# Patient Record
Sex: Female | Born: 1990 | Race: Black or African American | Hispanic: No | Marital: Single | Smoking: Current some day smoker
Health system: Southern US, Community
[De-identification: ages and names within clinical notes are randomized; demographics above are authoritative.]

## PROBLEM LIST (undated history)

## (undated) ENCOUNTER — Inpatient Hospital Stay (HOSPITAL_COMMUNITY): Payer: Self-pay

## (undated) DIAGNOSIS — T7840XA Allergy, unspecified, initial encounter: Secondary | ICD-10-CM

## (undated) DIAGNOSIS — F419 Anxiety disorder, unspecified: Secondary | ICD-10-CM

## (undated) DIAGNOSIS — G43909 Migraine, unspecified, not intractable, without status migrainosus: Secondary | ICD-10-CM

## (undated) DIAGNOSIS — D573 Sickle-cell trait: Secondary | ICD-10-CM

## (undated) DIAGNOSIS — D649 Anemia, unspecified: Secondary | ICD-10-CM

## (undated) HISTORY — PX: WISDOM TOOTH EXTRACTION: SHX21

## (undated) HISTORY — DX: Allergy, unspecified, initial encounter: T78.40XA

## (undated) HISTORY — DX: Anxiety disorder, unspecified: F41.9

## (undated) HISTORY — DX: Anemia, unspecified: D64.9

## (undated) HISTORY — PX: FOOT SURGERY: SHX648

---

## 2015-10-01 ENCOUNTER — Emergency Department (HOSPITAL_COMMUNITY)
Admission: EM | Admit: 2015-10-01 | Discharge: 2015-10-01 | Disposition: A | Discharge status: Home / Self Care | Payer: PRIVATE HEALTH INSURANCE | Attending: Emergency Medicine | Admitting: Emergency Medicine

## 2015-10-01 ENCOUNTER — Encounter (HOSPITAL_COMMUNITY): Payer: Self-pay | Admitting: Emergency Medicine

## 2015-10-01 ENCOUNTER — Emergency Department (HOSPITAL_COMMUNITY): Payer: PRIVATE HEALTH INSURANCE

## 2015-10-01 DIAGNOSIS — R51 Headache: Secondary | ICD-10-CM | POA: Diagnosis not present

## 2015-10-01 DIAGNOSIS — R072 Precordial pain: Secondary | ICD-10-CM | POA: Diagnosis not present

## 2015-10-01 DIAGNOSIS — F17200 Nicotine dependence, unspecified, uncomplicated: Secondary | ICD-10-CM | POA: Diagnosis not present

## 2015-10-01 DIAGNOSIS — R519 Headache, unspecified: Secondary | ICD-10-CM

## 2015-10-01 DIAGNOSIS — R079 Chest pain, unspecified: Secondary | ICD-10-CM

## 2015-10-01 HISTORY — DX: Migraine, unspecified, not intractable, without status migrainosus: G43.909

## 2015-10-01 LAB — BASIC METABOLIC PANEL
Anion gap: 6 (ref 5–15)
BUN: 10 mg/dL (ref 6–20)
CO2: 25 mmol/L (ref 22–32)
CREATININE: 0.85 mg/dL (ref 0.44–1.00)
Calcium: 9.1 mg/dL (ref 8.9–10.3)
Chloride: 106 mmol/L (ref 101–111)
GFR calc Af Amer: >60 mL/min (ref >60)
GLUCOSE: 93 mg/dL (ref 65–99)
POTASSIUM: 3.8 mmol/L (ref 3.5–5.1)
SODIUM: 137 mmol/L (ref 135–145)

## 2015-10-01 LAB — CBC
HEMATOCRIT: 38.2 % (ref 36.0–46.0)
Hemoglobin: 12.9 g/dL (ref 12.0–15.0)
MCH: 28.3 pg (ref 26.0–34.0)
MCHC: 33.8 g/dL (ref 30.0–36.0)
MCV: 83.8 fL (ref 78.0–100.0)
PLATELETS: 262 10*3/uL (ref 150–400)
RBC: 4.56 MIL/uL (ref 3.87–5.11)
RDW: 12.9 % (ref 11.5–15.5)
WBC: 9 10*3/uL (ref 4.0–10.5)

## 2015-10-01 LAB — I-STAT TROPONIN, ED: Troponin i, poc: 0 ng/mL (ref 0.00–0.08)

## 2015-10-01 MED ORDER — IBUPROFEN 400 MG PO TABS
600.0000 mg | ORAL_TABLET | Freq: Once | ORAL | Status: AC
  Administered 2015-10-01: 600 mg via ORAL
  Filled 2015-10-01: qty 1

## 2015-10-01 MED ORDER — METOCLOPRAMIDE HCL 10 MG PO TABS
10.0000 mg | ORAL_TABLET | Freq: Once | ORAL | Status: AC
  Administered 2015-10-01: 10 mg via ORAL
  Filled 2015-10-01: qty 1

## 2015-10-01 MED ORDER — DIPHENHYDRAMINE HCL 25 MG PO CAPS
50.0000 mg | ORAL_CAPSULE | Freq: Once | ORAL | Status: AC
  Administered 2015-10-01: 50 mg via ORAL
  Filled 2015-10-01: qty 2

## 2015-10-01 NOTE — ED Notes (Signed)
Discussed d/c instructions w/ pt. Pt states "why did you give me medications for my migraine when I am here for CP?" This RN explained advil will help w/ decreasing inflammation causing pain. Explained reglan + benadryl + advil work together to relieve migraine. Pt states that it's "wrong for staff to give a patient benadryl when they drove themselves here." This RN explained pt can wait in lobby until drowsiness wears off or take cab or bus home. Also explained SW to speak w/ pt in morning. Charge RN aware of pt and is to speak w/ pt.

## 2015-10-01 NOTE — Progress Notes (Signed)
CSW engaged with Patient in the waiting room re: homelessness issues. Patient requesting emergent housing. CSW encouraged Patient to contact the [place] [facility] for free housing counseling assistance in locating affordable rental housing or housing with support services for families and individuals in crisis and chronically homeless. CSW also provided Patient with a list of local shelters. Patient appreciative of resources provided. CSW signing off. Please contact if new need(s) arise.      Lance MussAshley Gardner,MSW, LCSW [facility] ED/41M Clinical Social Worker [phone]

## 2015-10-01 NOTE — ED Notes (Signed)
Pt to be d/c to wait in lobby for SW to discuss resources available to pt in morning.

## 2015-10-01 NOTE — ED Notes (Signed)
Patient ambulated without difficulty from department.

## 2015-10-01 NOTE — ED Notes (Signed)
Patient with chest pain and shortness of breath for the last few days.  She states that it radiates all over chest, no specific place.  Patient is CAOx4 in triage.

## 2015-10-01 NOTE — ED Notes (Signed)
This Consulting civil engineerCharge RN entered room to speak with patient in reference to request made by Hubert AzureBrooke H. RN. Upon entering patient appeared anxious. As this RN engaged patient in conversation patient was unable to sit calmly and converse with this RN to communicate concerns. Patient expressed that her displeasure with the course of treatment which she received here. She reported that the MD and RN did not address her chest pain, but instead treated her for her headache. Patient stated that her primary reason for visit this AM was chest pain and shortness of breath. Patient explained that she wasn't concerned about her headache. Patient went on to explain that she was upset because we gave her medications that made her sleepy, didn't give her the option to refuse, and didn't advise her of the symptoms which we were intending to treat by giving her said medications. Patient continues on now explaining that she can't be discharged because those medications are sedating and they may impair her ability to drive and get home safely. Patient requests "a place to sleep off her medications". This RN noted patient to be speaking clearly in full sentences, able to ambulate independently with steady gait, with not apparent impairment. Patient requested resource guide. I advised MD Oni of patient's request. Resource guide provided. Patient asking for information regarding social work consult. This RN advised patient that CSW is available after 0800, and that she may wait to see them in the waiting area.

## 2015-10-01 NOTE — Discharge Instructions (Signed)
The [facility] The [employer] [facility] 211 is a great source of information about community services available.  Access by dialing 2-1-1 from anywhere in [place], or by website -  PooledIncome.pl.   Other Local Resources (Updated 04/2015)  Shelters  Services   Phone Number and Address  [facility]  Housing for homeless and needy men with substance abuse issues [phone] [address] [place], [place]  [employer] [facility] [place]  Emergency assistance  [employer]  Pantry services [phone] [place], [place]  [facility]  Domestic violence shelter for women and their children [phone] [place], [place]  [facility]  Domestic violence shelter for women and their children [phone] [place], [place]  [facility])     The [facility] coordinates access to most shelters in [place]  Apply in person Monday - Friday, 10 am - 4 pm.    After hours/ weekends, contact individual shelters directly [phone] [address] [place], [place]  [facility] - [employer]  Food  Emergency financial assistance  Permanent supportive housing [phone] [address] [place], [place]   [employer]   Crisis assistance  Medication  Housing  Food  Utility assistance [phone] [address] [place], [place]  [employer]    Crisis assistance  Medication  Housing  Food  Utility assistance [phone] [address], [place], [place]  [employer] of [place]     Transitional housing  Case Proofreader assistance [phone] [address] [place], [place]  Lysle Morales, [employer] for adult men and women [phone] [address] [place], [place]  24-hour Crisis Line for those Facing Homelessness     [phone]   [employer] Guide Social Services The United Ways 211 is a great source of information about community services available.  Access by dialing 2-1-1 from anywhere in [place], or by website -  PooledIncome.pl.   Other Local Resources (Updated 04/2015)  Social  Contractor Number and Address  Aging, Disability and Transit Services  In-home services  Meals on wheels  Community meal sites  Transportation services  Adult day health care  Center for Active Retirement  Family caregiver support services [phone] [place], [place]  [place] [employer] of Social Services  Aging and adult services  Childrens services  Deaf-Blind services  Disability services  Guardianship  Hearing loss (assistive technology, interpreters, etc.)  Low-income services (health care, child care, housing, financial and nutrition assistance)  Medicaid  Pregnancy services  Utility assistance  Veterans services [phone] [place], [place] [address]    ElderCare  Assessment  Care management  Family education  Information and referral [phone] [address] [place], [place] [address]  [facility] Social Services  Aging and adult services  Childrens services  Deaf-Blind services  Disability services  Guardianship  Hearing loss (assistive technology, interpreters, etc.)  Low-income services (health care, child care, housing, financial and nutrition assistance)  [facility]  Pregnancy services  Utility assistance  Veterans services [phone] [address] [place], [place] [address]  [facility] Department of Social Services  Aging and adult services  Childrens services  Deaf-Blind services  Disability services  Guardianship  Hearing loss (assistive technology, interpreters, etc.)  Low-income services (health care, child care, housing, financial  and nutrition assistance)  Medicaid  Pregnancy services  Utility assistance  Veterans services [phone] [address] [place], [place] [address]  [facility] Department of Health and [employer] Services  Aging and adult services  Childrens services  Deaf-Blind services  Disability services  Guardianship  Hearing loss (assistive technology, interpreters, etc.)  Low-income services (health care, child care, housing, financial and nutrition assistance)  Medicaid  Pregnancy services  Utility assistance  Veterans services [phone] [address] [place], [place] [address]   [facility] Department of Social Services  Aging and adult services  Childrens services  Deaf-Blind services  Disability services  Guardianship  Hearing loss (assistive technology, interpreters, etc.)  Low-income services (health care, child care, housing, financial and nutrition assistance)  Medicaid  Pregnancy services  Utility assistance  Veterans services [phone] [address], [place], [place] [address]  [facility] Division of Social Services  Aging and adult services  Childrens services  Deaf-Blind services  Disability services  Guardianship  Hearing loss (assistive technology, interpreters, etc.)  Low-income services (health care, child care, housing, financial and nutrition assistance)  Medicaid  Pregnancy services  Utility assistance  Veterans services [phone] [place], [place] [address]  [facility] of [place]  Serves adults age 23 and over and their families  Caregiver information  Foster grandparents  Forensic scientistGrandparents Raising Grandchildren  Mobile meals  Refugee programs  Retired Herbalist& Senior Volunteer Program (RSVP)  Customer service managerural Outreach  Senior Center  Seniors Health Insurance Information Program [facility](SHIIP)  Administrator, Civil Serviceenior Wheels Medical Transportation Program  SeniorLine  Support Groups  TeleCare  [phone]  [address] [place], [place] [address]  [phone]  [address] [place], [place] [address]  Senior [employer]  Help Line  Home Care  Living at Goldman SachsHome  Meals on Wheels  Senior Lunch Program  Adult Day Center  Equipment [facility]  Support Groups  Care Partner Workshops  Referrals for chore and homemaker services [phone]  [address] [place], [place] [address]  The [employer]  Crisis assistance  Medication assistance  Rental assistance  Food pantry  Medication assistance  Housing assistance  Emergency food distribution  Utility assistance  Boys and Girls Clubs [phone] [address] [place], [place]   [phone](Tuesdays and Thursdays from 9am - 12 noon by appointment only) [address] [place], [place] [address]  [phone] [address] [place], [place] [address]

## 2015-10-01 NOTE — ED Provider Notes (Signed)
CSN: [id number]     Arrival date & time 10/01/15  [id number] History   First MD Initiated Contact with Patient 10/01/15 [phone]     Chief Complaint  Patient presents with  . Chest Pain  . Shortness of Breath     (Consider location/radiation/quality/duration/timing/severity/associated sxs/prior Treatment) HPI  Barbara Ramos is a 25 y.o. female with past history of migraines presenting today with chest pain shortness of breath. Patient states it has been going on for the past 3 days. It is substernal and a tight feeling. She states is worse sometimes when she speaks. Nothing makes it better is simply goes late on tone. She has associated shortness of breath. She denies vomiting or sweating. Patient states her pain is now getting her a headache which is consistent with her normal migraine headaches. These were gradual in onset. She denies any history of ACS, or blood clots. She denies any estrogen use, long distance travel, or surgeries. She has no cramping or swelling in her legs. There are no further complaints.  10 Systems reviewed and are negative for acute change except as noted in the HPI.      Past Medical History  Diagnosis Date  . Migraines    History reviewed. No pertinent past surgical history. No family history on file. Social History  Substance Use Topics  . Smoking status: Current Every Day Smoker  . Smokeless tobacco: None  . Alcohol Use: No   OB History    No data available     Review of Systems    Allergies  Review of patient's allergies indicates no known allergies.  Home Medications   Prior to Admission medications   Not on File   BP 114/83 mmHg  Pulse 64  Temp(Src) 98.2 F (36.8 C) (Oral)  Resp 13  Ht 5' 4.5" (1.638 m)  Wt 122 lb 9 oz (55.594 kg)  BMI 20.72 kg/m2  SpO2 99%  LMP 09/26/2015 (Approximate) Physical Exam  Constitutional: She is oriented to person, place, and time. She appears well-developed and well-nourished. No distress.  HENT:  Head:  Normocephalic and atraumatic.  Nose: Nose normal.  Mouth/Throat: Oropharynx is clear and moist. No oropharyngeal exudate.  Eyes: Conjunctivae and EOM are normal. Pupils are equal, round, and reactive to light. No scleral icterus.  Neck: Normal range of motion. Neck supple. No JVD present. No tracheal deviation present. No thyromegaly present.  Cardiovascular: Normal rate, regular rhythm and normal heart sounds.  Exam reveals no gallop and no friction rub.   No murmur heard. Pulmonary/Chest: Effort normal and breath sounds normal. No respiratory distress. She has no wheezes. She exhibits no tenderness.  Abdominal: Soft. Bowel sounds are normal. She exhibits no distension and no mass. There is no tenderness. There is no rebound and no guarding.  Musculoskeletal: Normal range of motion. She exhibits no edema or tenderness.  Lymphadenopathy:    She has no cervical adenopathy.  Neurological: She is alert and oriented to person, place, and time. No cranial nerve deficit. She exhibits normal muscle tone.  Skin: Skin is warm and dry. No rash noted. No erythema. No pallor.  Nursing note and vitals reviewed.   ED Course  Procedures (including critical care time) Labs Review Labs Reviewed  BASIC METABOLIC PANEL  CBC  I-STAT TROPOININ, ED    Imaging Review Dg Chest 2 View  10/01/2015  CLINICAL DATA:  Chest pain and shortness of breath EXAM: CHEST  2 VIEW COMPARISON:  None. FINDINGS: Normal heart size and mediastinal contours.  No acute infiltrate or edema. No effusion or pneumothorax. No acute osseous findings. IMPRESSION: Negative chest. Electronically Signed   By: Marnee Spring M.D.   On: 10/01/2015 04:22   I have personally reviewed and evaluated these images and lab results as part of my medical decision-making.   EKG Interpretation   Date/Time:  Friday October 01 2015 03:41:23 EDT Ventricular Rate:  63 PR Interval:  148 QRS Duration: 76 QT Interval:  398 QTC Calculation: 407 R Axis:    80 Text Interpretation:  Normal sinus rhythm Nonspecific T wave abnormality  Abnormal ECG No old tracing to compare Confirmed by Erroll Luna  [phone]) on 10/01/2015 4:15:09 AM      MDM   Final diagnoses:  None    Patient presents emergency department for chest pain and shortness of breath as well as headache. I do not believe her history is consistent with ACS, in addition her heart score is 0. She is PERC negative. I do not believe this represents an acute cardiac issue. She was given ibuprofen, Reglan, Benadryl for her headache. Advised to follow-up with her primary care physician, her vital signs were within her normal limits, she appears in no acute distress, patient safe for discharge.    Tomasita Crumble, MD 10/01/15 [phone]

## 2015-10-19 ENCOUNTER — Encounter (HOSPITAL_COMMUNITY): Payer: Self-pay | Admitting: Emergency Medicine

## 2015-10-19 ENCOUNTER — Emergency Department (HOSPITAL_COMMUNITY)
Admission: EM | Admit: 2015-10-19 | Discharge: 2015-10-19 | Disposition: A | Discharge status: Home / Self Care | Payer: PRIVATE HEALTH INSURANCE | Attending: Emergency Medicine | Admitting: Emergency Medicine

## 2015-10-19 ENCOUNTER — Other Ambulatory Visit: Payer: Self-pay

## 2015-10-19 DIAGNOSIS — F419 Anxiety disorder, unspecified: Secondary | ICD-10-CM | POA: Diagnosis not present

## 2015-10-19 DIAGNOSIS — R072 Precordial pain: Secondary | ICD-10-CM | POA: Diagnosis not present

## 2015-10-19 DIAGNOSIS — F17210 Nicotine dependence, cigarettes, uncomplicated: Secondary | ICD-10-CM | POA: Insufficient documentation

## 2015-10-19 MED ORDER — LORAZEPAM 1 MG PO TABS
1.0000 mg | ORAL_TABLET | Freq: Once | ORAL | Status: AC
  Administered 2015-10-19: 1 mg via ORAL
  Filled 2015-10-19: qty 1

## 2015-10-19 NOTE — ED Notes (Signed)
Pt c/o mid chest pain that occurs with breathing.  States she also has anxiety attacks and felt this was the onset of one but went to sleep and woke with continued chest discomfort.  Denies cough, congestion.  Appears nasally congested but reports its because she was crying earlier. 8/10 pain.
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
## 2015-10-19 NOTE — Discharge Instructions (Signed)
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
## 2017-05-24 ENCOUNTER — Emergency Department (HOSPITAL_COMMUNITY)
Admission: EM | Admit: 2017-05-24 | Discharge: 2017-05-24 | Disposition: A | Discharge status: Home / Self Care | Payer: PRIVATE HEALTH INSURANCE | Attending: Emergency Medicine | Admitting: Emergency Medicine

## 2017-05-24 ENCOUNTER — Other Ambulatory Visit: Payer: Self-pay

## 2017-05-24 ENCOUNTER — Encounter (HOSPITAL_COMMUNITY): Payer: Self-pay | Admitting: Neurology

## 2017-05-24 DIAGNOSIS — R51 Headache: Secondary | ICD-10-CM

## 2017-05-24 DIAGNOSIS — J029 Acute pharyngitis, unspecified: Secondary | ICD-10-CM | POA: Diagnosis not present

## 2017-05-24 DIAGNOSIS — F17210 Nicotine dependence, cigarettes, uncomplicated: Secondary | ICD-10-CM | POA: Insufficient documentation

## 2017-05-24 DIAGNOSIS — R519 Headache, unspecified: Secondary | ICD-10-CM

## 2017-05-24 LAB — INFLUENZA PANEL BY PCR (TYPE A & B)
Influenza A By PCR: NEGATIVE
Influenza B By PCR: NEGATIVE

## 2017-05-24 LAB — RAPID STREP SCREEN (MED CTR MEBANE ONLY): Streptococcus, Group A Screen (Direct): NEGATIVE

## 2017-05-24 MED ORDER — ACETAMINOPHEN 325 MG PO TABS
650.0000 mg | ORAL_TABLET | Freq: Once | ORAL | Status: AC | PRN
  Administered 2017-05-24: 650 mg via ORAL
  Filled 2017-05-24: qty 2

## 2017-05-24 MED ORDER — KETOROLAC TROMETHAMINE 30 MG/ML IJ SOLN
30.0000 mg | Freq: Once | INTRAMUSCULAR | Status: AC
  Administered 2017-05-24: 30 mg via INTRAVENOUS
  Filled 2017-05-24: qty 1

## 2017-05-24 MED ORDER — SODIUM CHLORIDE 0.9 % IV BOLUS (SEPSIS)
1000.0000 mL | Freq: Once | INTRAVENOUS | Status: AC
  Administered 2017-05-24: 1000 mL via INTRAVENOUS

## 2017-05-24 NOTE — ED Notes (Signed)
Pt ambulated to room with no difficulty.  

## 2017-05-24 NOTE — ED Provider Notes (Signed)
[facility]   CSN: [id number] Arrival date & time: 05/24/17  1402     History   Chief Complaint Chief Complaint  Patient presents with  . Migraine    HPI Barbara Ramos is a 27 y.o. female.  HPI   27 year old female presents today with complaints of sore throat and headache.  Patient has a history of migraines.  She notes yesterday she started having a migraine typical of previous, she notes this is diffuse, non-localized.  She denies any neurological deficits.  She notes that she took Excedrin last night and went to bed but woke up and still continued to have a migraine.  Patient notes that she also started having a sore throat today, no difficulty swallowing or pooling of secretions.  She denies any neck stiffness, head trauma, cough, congestion runny nose, abdominal pain nausea or vomiting.  Patient was vaccinated as a child, she did not receive a flu vaccination this year.    Past Medical History:  Diagnosis Date  . Migraines     There are no active problems to display for this patient.   History reviewed. No pertinent surgical history.  OB History    No data available       Home Medications    Prior to Admission medications   Not on File    Family History No family history on file.  Social History Social History   Tobacco Use  . Smoking status: Current Every Day Smoker    Packs/day: 0.01    Types: Cigarettes  Substance Use Topics  . Alcohol use: No  . Drug use: No     Allergies   Patient has no known allergies.   Review of Systems Review of Systems  All other systems reviewed and are negative.    Physical Exam Updated Vital Signs BP 122/82 (BP Location: Right Arm)   Pulse (!) 110   Temp 98.5 F (36.9 C) (Oral)   Resp 18   Ht 5' 4.5" (1.638 m)   Wt 66.7 kg (147 lb)   LMP 05/20/2017   SpO2 100%   BMI 24.84 kg/m   Physical Exam  Constitutional: She is oriented to person, place,  and time. She appears well-developed and well-nourished.  HENT:  Head: Normocephalic and atraumatic.  Mouth/Throat: Tonsils are 2+ on the right. Tonsils are 2+ on the left.  Bilateral tonsillar swelling small amount of exudate on the right tonsil, they are symmetrical no signs of PTA or RPA, uvula midline no edema rises with phonation no posterior oropharynx swelling or edema  Eyes: Conjunctivae are normal. Pupils are equal, round, and reactive to light. Right eye exhibits no discharge. Left eye exhibits no discharge. No scleral icterus.  Neck: Normal range of motion. No JVD present. No tracheal deviation present.  Pulmonary/Chest: Effort normal. No stridor.  Neurological: She is alert and oriented to person, place, and time. She displays normal reflexes. No cranial nerve deficit or sensory deficit. She exhibits normal muscle tone. Coordination normal.  Skin: Skin is warm.  Psychiatric: She has a normal mood and affect. Her behavior is normal. Judgment and thought content normal.  Nursing note and vitals reviewed.    ED Treatments / Results  Labs (all labs ordered are listed, but only abnormal results are displayed) Labs Reviewed  RAPID STREP SCREEN (NOT AT [facility])  CULTURE, GROUP A STREP [facility])  INFLUENZA PANEL BY PCR (TYPE A & B)    EKG  EKG Interpretation None  Radiology No results found.  Procedures Procedures (including critical care time)  Medications Ordered in ED Medications  acetaminophen (TYLENOL) tablet 650 mg (650 mg Oral Given 05/24/17 1446)  ketorolac (TORADOL) 30 MG/ML injection 30 mg (30 mg Intravenous Given 05/24/17 1633)  sodium chloride 0.9 % bolus 1,000 mL (1,000 mLs Intravenous New Bag/Given 05/24/17 1633)     Initial Impression / Assessment and Plan / ED Course  I have reviewed the triage vital signs and the nursing notes.  Pertinent labs & imaging results that were available during my care of the patient were reviewed by me and considered in my  medical decision making (see chart for details).      Final Clinical Impressions(s) / ED Diagnoses   Final diagnoses:  Viral pharyngitis  Nonintractable headache, unspecified chronicity pattern, unspecified headache type   Labs: Influenza, rapid strep  Imaging:  Consults:  Therapeutics:  Discharge Meds:   Assessment/Plan: 27 year old female presents today with likely viral pharyngitis.  Patient with a sore throat and headache.  She does have signs consistent with pharyngitis, negative strep negative influenza here.  With her temperature was improved with antipyretics, headache completely resolved.  She has no neck stiffness, no confusion, no signs of bacterial meningitis.  I did discuss her strict return precautions in detail, patient verbalized she would return within the next 24 hours if symptoms persisted, or sooner as needed.  She verbalized understanding and agreement to today's plan had no further questions or concerns at time of discharge.   ED Discharge Orders    None       Rosalio Loud 05/24/17 1823    Nira Conn, MD 05/24/17 2043

## 2017-05-24 NOTE — ED Triage Notes (Addendum)
Pt reports migraine hx, started yesterday. Denies v/d. Has hx of same. C/o frontal h/a. Denies cough, sneezing. Temp 100.5. Took Excedrin Migraine with no relief. She does report sore throat. Tonsils are enlarged, white coated.

## 2017-05-24 NOTE — Discharge Instructions (Signed)
Please read the attached information.  Please use Tylenol or ibuprofen as needed for discomfort.  If your headache returns and is severe and does not improve with over-the-counter medication please return to the emergency room immediately.  If you develop any of the concerning signs or symptoms we discussed including neck stiffness, confusion, or any other concerning signs or symptoms return immediately for evaluation.

## 2017-05-26 LAB — CULTURE, GROUP A STREP (THRC)

## 2017-10-11 ENCOUNTER — Ambulatory Visit (INDEPENDENT_AMBULATORY_CARE_PROVIDER_SITE_OTHER)
Admission: EM | Admit: 2017-10-11 | Discharge: 2017-10-11 | Disposition: A | Discharge status: Home / Self Care | Payer: PRIVATE HEALTH INSURANCE | Source: Home / Self Care | Attending: Family Medicine | Admitting: Family Medicine

## 2017-10-11 ENCOUNTER — Encounter (HOSPITAL_COMMUNITY): Payer: Self-pay | Admitting: Emergency Medicine

## 2017-10-11 ENCOUNTER — Other Ambulatory Visit: Payer: Self-pay

## 2017-10-11 ENCOUNTER — Emergency Department (HOSPITAL_COMMUNITY)
Admission: EM | Admit: 2017-10-11 | Discharge: 2017-10-11 | Disposition: A | Discharge status: Home / Self Care | Payer: PRIVATE HEALTH INSURANCE | Attending: Emergency Medicine | Admitting: Emergency Medicine

## 2017-10-11 DIAGNOSIS — Z886 Allergy status to analgesic agent status: Secondary | ICD-10-CM

## 2017-10-11 DIAGNOSIS — R109 Unspecified abdominal pain: Secondary | ICD-10-CM

## 2017-10-11 DIAGNOSIS — Z202 Contact with and (suspected) exposure to infections with a predominantly sexual mode of transmission: Secondary | ICD-10-CM | POA: Diagnosis not present

## 2017-10-11 DIAGNOSIS — R1030 Lower abdominal pain, unspecified: Secondary | ICD-10-CM | POA: Diagnosis present

## 2017-10-11 DIAGNOSIS — N938 Other specified abnormal uterine and vaginal bleeding: Secondary | ICD-10-CM | POA: Insufficient documentation

## 2017-10-11 DIAGNOSIS — Z113 Encounter for screening for infections with a predominantly sexual mode of transmission: Secondary | ICD-10-CM | POA: Diagnosis not present

## 2017-10-11 DIAGNOSIS — F17210 Nicotine dependence, cigarettes, uncomplicated: Secondary | ICD-10-CM | POA: Diagnosis not present

## 2017-10-11 DIAGNOSIS — N939 Abnormal uterine and vaginal bleeding, unspecified: Secondary | ICD-10-CM

## 2017-10-11 LAB — URINALYSIS, ROUTINE W REFLEX MICROSCOPIC
Bilirubin Urine: NEGATIVE
Glucose, UA: NEGATIVE mg/dL
Ketones, ur: NEGATIVE mg/dL
Nitrite: NEGATIVE
Protein, ur: 30 mg/dL — AB
RBC / HPF: >50 RBC/hpf — ABNORMAL HIGH (ref 0–5)
Specific Gravity, Urine: 1.012 (ref 1.005–1.030)
pH: 6 (ref 5.0–8.0)

## 2017-10-11 LAB — CBC
HCT: 40.9 % (ref 36.0–46.0)
Hemoglobin: 13.4 g/dL (ref 12.0–15.0)
MCH: 29 pg (ref 26.0–34.0)
MCHC: 32.8 g/dL (ref 30.0–36.0)
MCV: 88.5 fL (ref 78.0–100.0)
PLATELETS: 267 10*3/uL (ref 150–400)
RBC: 4.62 MIL/uL (ref 3.87–5.11)
RDW: 13.2 % (ref 11.5–15.5)
WBC: 8.2 10*3/uL (ref 4.0–10.5)

## 2017-10-11 LAB — COMPREHENSIVE METABOLIC PANEL
ALK PHOS: 60 U/L (ref 38–126)
ALT: 14 U/L (ref 14–54)
AST: 16 U/L (ref 15–41)
Albumin: 4 g/dL (ref 3.5–5.0)
Anion gap: 6 (ref 5–15)
BILIRUBIN TOTAL: 0.5 mg/dL (ref 0.3–1.2)
BUN: 9 mg/dL (ref 6–20)
CALCIUM: 9.2 mg/dL (ref 8.9–10.3)
CHLORIDE: 110 mmol/L (ref 101–111)
CO2: 25 mmol/L (ref 22–32)
Creatinine, Ser: 0.78 mg/dL (ref 0.44–1.00)
GFR calc Af Amer: >60 mL/min (ref >60)
Glucose, Bld: 98 mg/dL (ref 65–99)
Potassium: 4.4 mmol/L (ref 3.5–5.1)
Sodium: 141 mmol/L (ref 135–145)
Total Protein: 6.8 g/dL (ref 6.5–8.1)

## 2017-10-11 LAB — I-STAT BETA HCG BLOOD, ED (MC, WL, AP ONLY)
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
## 2017-10-11 NOTE — ED Provider Notes (Signed)
[facility] Provider Note   CSN: [id number] Arrival date & time: 10/11/17  [id number]     History   Chief Complaint Chief Complaint  Patient presents with  . Abdominal Pain    HPI Barbara Ramos is a 27 y.o. female presenting for evaluation of vaginal spotting and lower abdominal discomfort.  Patient states that she and her fianc been trying to get pregnant.  Her period started today, it was light and pink.  It has stopped already.  She states this is abnormal for her.  She also reports that her "uterus feels different."  She denies pain and states it is not severe or concerning.  She wants to be checked to see if she is pregnant.  She does not feel that there is anything wrong at this time.  She reports urinary frequency.  She denies dysuria.  She denies fevers, chills, chest pain, shortness of breath, or abdominal pain.  She reports mild nausea without vomiting.  She has taken multiple home pregnancy tests which have all been negative.  She has no medical problems, takes no medications daily.  She denies vaginal discharge.  HPI  Past Medical History:  Diagnosis Date  . Migraines     There are no active problems to display for this patient.   History reviewed. No pertinent surgical history.   OB History   None      Home Medications    Prior to Admission medications   Not on File    Family History Family History  Problem Relation Age of Onset  . Healthy Mother     Social History Social History   Tobacco Use  . Smoking status: Current Every Day Smoker    Packs/day: 0.01    Types: Cigarettes  Substance Use Topics  . Alcohol use: No  . Drug use: No     Allergies   Ibuprofen   Review of Systems Review of Systems  Gastrointestinal: Positive for nausea. Negative for abdominal pain and vomiting.  Genitourinary: Positive for frequency and vaginal bleeding (resolved). Negative for dysuria and hematuria.     Physical  Exam Updated Vital Signs BP 122/84   Pulse 81   Temp 98.3 F (36.8 C) (Oral)   Resp 16   LMP 10/10/2017 (Exact Date)   SpO2 100%   Physical Exam  Constitutional: She is oriented to person, place, and time. She appears well-developed and well-nourished. No distress.  Appears in no distress  HENT:  Head: Normocephalic and atraumatic.  Eyes: Pupils are equal, round, and reactive to light. Conjunctivae and EOM are normal.  Neck: Normal range of motion. Neck supple.  Cardiovascular: Normal rate, regular rhythm and intact distal pulses.  Pulmonary/Chest: Effort normal and breath sounds normal. No respiratory distress. She has no wheezes.  Abdominal: Soft. Bowel sounds are normal. She exhibits no distension and no mass. There is no tenderness. There is no rebound and no guarding.  No tenderness to palpation of the abdomen.  Soft without rigidity, guarding, distention.  Musculoskeletal: Normal range of motion.  Neurological: She is alert and oriented to person, place, and time.  Skin: Skin is warm and dry.  Psychiatric: She has a normal mood and affect.  Nursing note and vitals reviewed.    ED Treatments / Results  Labs (all labs ordered are listed, but only abnormal results are displayed) Labs Reviewed  URINALYSIS, ROUTINE W REFLEX MICROSCOPIC - Abnormal; Notable for the following components:      Result Value  Color, Urine PINK (*)    APPearance HAZY (*)    Hgb urine dipstick LARGE (*)    Protein, ur 30 (*)    Leukocytes, UA TRACE (*)    RBC / HPF >50 (*)    Bacteria, UA RARE (*)    All other components within normal limits  COMPREHENSIVE METABOLIC PANEL  CBC  I-STAT BETA HCG BLOOD, ED ([facility], [facility], [facility] ONLY)    EKG None  Radiology No results found.  Procedures Procedures (including critical care time)  Medications Ordered in ED Medications - No data to display   Initial Impression / Assessment and Plan / ED Course  I have reviewed the triage vital signs and the  nursing notes.  Pertinent labs & imaging results that were available during my care of the patient were reviewed by me and considered in my medical decision making (see chart for details).     Pt presenting for evaluation for possible pregnancy.  Physical exam reassuring, patient is afebrile not tachycardic.  She appears nontoxic.  She is walking without difficulty or signs of pain.  She is nontoxic.  Abdominal exam reassuring, no tenderness.  No vaginal bleeding or discharge.  Patient states pain is not severe, nor does she feel that there are any complications at this time.  Requesting pregnancy test.  Will obtain labs, pregnancy, and urine.  Labs show patient is not pregnant.  Otherwise reassuring, no leukocytosis, creatinine stable.  Urine without infection.  Shows blood, patient started her period today.  Discussed with patient.  Discussed importance of follow-up with OB/GYN to discuss difficulties getting pregnant, and for further pregnancy needs.  At this time, patient appears safe for discharge.  Return precautions given.  Patient states she understands and agrees to plan.   Final Clinical Impressions(s) / ED Diagnoses   Final diagnoses:  Vagina bleeding    ED Discharge Orders    None       Alveria Apley, PA-C 10/11/17 1304    Terrilee Files, MD 10/12/17 [phone]

## 2017-10-11 NOTE — Discharge Instructions (Addendum)
Follow-up with OB/ GYN for further evaluation of your symptoms.  Follow-up with concerns about pregnancy and for any future pregnancy needs. Return to the emergency room if you develop fevers, severe abdominal pain, inability to urinate, or any new or concerning symptoms.

## 2017-10-11 NOTE — ED Triage Notes (Signed)
Patient to ED c/o lower abdominal cramping and spotting since yesterday. Patient states she is trying to get pregnant and LMP was supposed to begin today, but the bleeding and cramping she is having are not like her normal cycle. Denies N/V/D, no fevers/chills.

## 2017-10-11 NOTE — ED Triage Notes (Signed)
Patient denies any vaginal discharge.  Patient denies any specific suspicion for an std.

## 2017-10-11 NOTE — Discharge Instructions (Signed)
We will notify you of any positive results and if any treatments are indicated.  You may also monitor on your MyChart online to see results. Use of condoms until results finalize. Please establish with a gynecologist if any further questions or concerns in regards to getting pregnant

## 2017-10-12 LAB — URINE CYTOLOGY ANCILLARY ONLY
Chlamydia: NEGATIVE
Neisseria Gonorrhea: NEGATIVE
Trichomonas: POSITIVE — AB

## 2017-10-12 LAB — RPR: RPR: NONREACTIVE

## 2017-10-12 LAB — HIV ANTIBODY (ROUTINE TESTING W REFLEX): HIV SCREEN 4TH GENERATION: NONREACTIVE

## 2017-10-13 ENCOUNTER — Other Ambulatory Visit: Payer: Self-pay

## 2017-10-13 ENCOUNTER — Ambulatory Visit (HOSPITAL_COMMUNITY)
Admission: EM | Admit: 2017-10-13 | Discharge: 2017-10-13 | Disposition: A | Discharge status: Home / Self Care | Payer: PRIVATE HEALTH INSURANCE | Attending: Internal Medicine | Admitting: Internal Medicine

## 2017-10-13 ENCOUNTER — Encounter (HOSPITAL_COMMUNITY): Payer: Self-pay

## 2017-10-13 DIAGNOSIS — A599 Trichomoniasis, unspecified: Secondary | ICD-10-CM

## 2017-10-13 DIAGNOSIS — Z1389 Encounter for screening for other disorder: Secondary | ICD-10-CM | POA: Diagnosis not present

## 2017-10-13 LAB — POCT URINALYSIS DIP (DEVICE)
BILIRUBIN URINE: NEGATIVE
Glucose, UA: NEGATIVE mg/dL
KETONES UR: NEGATIVE mg/dL
NITRITE: NEGATIVE
PH: 7 (ref 5.0–8.0)
PROTEIN: NEGATIVE mg/dL
Specific Gravity, Urine: 1.015 (ref 1.005–1.030)
Urobilinogen, UA: 0.2 mg/dL (ref 0.0–1.0)

## 2017-10-13 MED ORDER — METRONIDAZOLE 500 MG PO TABS
ORAL_TABLET | ORAL | Status: AC
  Filled 2017-10-13: qty 3

## 2017-10-13 MED ORDER — METRONIDAZOLE 500 MG PO TABS
ORAL_TABLET | ORAL | Status: AC
  Filled 2017-10-13: qty 4

## 2017-10-13 MED ORDER — METRONIDAZOLE 500 MG PO TABS
2000.0000 mg | ORAL_TABLET | Freq: Once | ORAL | Status: AC
Start: 2017-10-13 — End: 2017-10-13
  Administered 2017-10-13: 2000 mg via ORAL

## 2017-10-13 NOTE — ED Triage Notes (Signed)
[facility], pt states she has been informed that she currently has Trichomoniasis and wants to be treated

## 2017-10-13 NOTE — Discharge Instructions (Addendum)
Urine cytology was positive for trichomoniasis performed 2 days ago.   2 gram of metronidazole given in office Advised to avoid alcohol after treatment given Follow up with PCP if symptoms persists Return or go to the ED if you experience new or worsening symptoms
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
## 2017-10-15 ENCOUNTER — Telehealth (HOSPITAL_COMMUNITY): Payer: Self-pay

## 2017-10-15 LAB — URINE CYTOLOGY ANCILLARY ONLY: Candida vaginitis: NEGATIVE

## 2017-10-16 ENCOUNTER — Telehealth (HOSPITAL_COMMUNITY): Payer: Self-pay

## 2017-10-16 NOTE — Telephone Encounter (Signed)
Bacterial Vaginosis test is positive.  Prescription for metronidazole was given at the urgent care visit. Pt contacted regarding results. Answered all questions. Verbalized understanding.   

## 2017-10-17 ENCOUNTER — Encounter: Payer: Self-pay | Admitting: Physician Assistant

## 2017-10-17 ENCOUNTER — Ambulatory Visit (INDEPENDENT_AMBULATORY_CARE_PROVIDER_SITE_OTHER): Payer: PRIVATE HEALTH INSURANCE | Admitting: Physician Assistant

## 2017-10-17 ENCOUNTER — Other Ambulatory Visit: Payer: Self-pay

## 2017-10-17 VITALS — BP 100/62 | HR 79 | Temp 98.8°F | Resp 16 | Ht 65.0 in | Wt 152.2 lb

## 2017-10-17 DIAGNOSIS — Z1329 Encounter for screening for other suspected endocrine disorder: Secondary | ICD-10-CM

## 2017-10-17 DIAGNOSIS — Z0000 Encounter for general adult medical examination without abnormal findings: Secondary | ICD-10-CM | POA: Diagnosis not present

## 2017-10-17 DIAGNOSIS — Z130 Encounter for screening for diseases of the blood and blood-forming organs and certain disorders involving the immune mechanism: Secondary | ICD-10-CM

## 2017-10-17 DIAGNOSIS — Z13228 Encounter for screening for other metabolic disorders: Secondary | ICD-10-CM

## 2017-10-17 DIAGNOSIS — Z124 Encounter for screening for malignant neoplasm of cervix: Secondary | ICD-10-CM | POA: Diagnosis not present

## 2017-10-17 LAB — POCT URINALYSIS DIP (MANUAL ENTRY)
Bilirubin, UA: NEGATIVE
Blood, UA: NEGATIVE
Glucose, UA: NEGATIVE mg/dL
Ketones, POC UA: NEGATIVE mg/dL
Leukocytes, UA: NEGATIVE
Nitrite, UA: NEGATIVE
Protein Ur, POC: NEGATIVE mg/dL
Spec Grav, UA: 1.015 (ref 1.010–1.025)
Urobilinogen, UA: 0.2 U/dL
pH, UA: 6 (ref 5.0–8.0)

## 2017-10-17 NOTE — Patient Instructions (Addendum)
We will contact you with the results of your labs.   Thank you for coming in today. Feel free to call PCP if you have any questions or further requests.  Please consider signing up for MyChart if you do not already have it, as this is a great way to communicate with me.  Best,  Whitney McVey, PA-C  Health Maintenance, Female Adopting a healthy lifestyle and getting preventive care can go a long way to promote health and wellness. Talk with your health care provider about what schedule of regular examinations is right for you. This is a good chance for you to check in with your provider about disease prevention and staying healthy. In between checkups, there are plenty of things you can do on your own. Experts have done a lot of research about which lifestyle changes and preventive measures are most likely to keep you healthy. Ask your health care provider for more information. Weight and diet Eat a healthy diet  Be sure to include plenty of vegetables, fruits, low-fat dairy products, and lean protein.  Do not eat a lot of foods high in solid fats, added sugars, or salt.  Get regular exercise. This is one of the most important things you can do for your health. ? Most adults should exercise for at least 150 minutes each week. The exercise should increase your heart rate and make you sweat (moderate-intensity exercise). ? Most adults should also do strengthening exercises at least twice a week. This is in addition to the moderate-intensity exercise.  Maintain a healthy weight  Body mass index (BMI) is a measurement that can be used to identify possible weight problems. It estimates body fat based on height and weight. Your health care provider can help determine your BMI and help you achieve or maintain a healthy weight.  For females 25 years of age and older: ? A BMI below 18.5 is considered underweight. ? A BMI of 18.5 to 24.9 is normal. ? A BMI of 25 to 29.9 is considered  overweight. ? A BMI of 30 and above is considered obese.  Watch levels of cholesterol and blood lipids  You should start having your blood tested for lipids and cholesterol at 25 years of age, then have this test every 5 years.  You may need to have your cholesterol levels checked more often if: ? Your lipid or cholesterol levels are high. ? You are older than 25 years of age. ? You are at high risk for heart disease.  Cancer screening Lung Cancer  Lung cancer screening is recommended for adults 70-25 years old who are at high risk for lung cancer because of a history of smoking.  A yearly low-dose CT scan of the lungs is recommended for people who: ? Currently smoke. ? Have quit within the past 15 years. ? Have at least a 30-pack-year history of smoking. A pack year is smoking an average of one pack of cigarettes a day for 1 year.  Yearly screening should continue until it has been 15 years since you quit.  Yearly screening should stop if you develop a health problem that would prevent you from having lung cancer treatment.  Breast Cancer  Practice breast self-awareness. This means understanding how your breasts normally appear and feel.  It also means doing regular breast self-exams. Let your health care provider know about any changes, no matter how small.  If you are in your 20s or 30s, you should have a clinical breast exam (CBE)  by a health care provider every 1-3 years as part of a regular health exam.  If you are 34 or older, have a CBE every year. Also consider having a breast X-ray (mammogram) every year.  If you have a family history of breast cancer, talk to your health care provider about genetic screening.  If you are at high risk for breast cancer, talk to your health care provider about having an MRI and a mammogram every year.  Breast cancer gene (BRCA) assessment is recommended for women who have family members with BRCA-related cancers. BRCA-related cancers  include: ? Breast. ? Ovarian. ? Tubal. ? Peritoneal cancers.  Results of the assessment will determine the need for genetic counseling and BRCA1 and BRCA2 testing.  Cervical Cancer Your health care provider may recommend that you be screened regularly for cancer of the pelvic organs (ovaries, uterus, and vagina). This screening involves a pelvic examination, including checking for microscopic changes to the surface of your cervix (Pap test). You may be encouraged to have this screening done every 3 years, beginning at age 25.  For women ages 75-65, health care providers may recommend pelvic exams and Pap testing every 3 years, or they may recommend the Pap and pelvic exam, combined with testing for human papilloma virus (HPV), every 5 years. Some types of HPV increase your risk of cervical cancer. Testing for HPV may also be done on women of any age with unclear Pap test results.  Other health care providers may not recommend any screening for nonpregnant women who are considered low risk for pelvic cancer and who do not have symptoms. Ask your health care provider if a screening pelvic exam is right for you.  If you have had past treatment for cervical cancer or a condition that could lead to cancer, you need Pap tests and screening for cancer for at least 20 years after your treatment. If Pap tests have been discontinued, your risk factors (such as having a new sexual partner) need to be reassessed to determine if screening should resume. Some women have medical problems that increase the chance of getting cervical cancer. In these cases, your health care provider may recommend more frequent screening and Pap tests.  Colorectal Cancer  This type of cancer can be detected and often prevented.  Routine colorectal cancer screening usually begins at 25 years of age and continues through 25 years of age.  Your health care provider may recommend screening at an earlier age if you have risk factors  for colon cancer.  Your health care provider may also recommend using home test kits to check for hidden blood in the stool.  A small camera at the end of a tube can be used to examine your colon directly (sigmoidoscopy or colonoscopy). This is done to check for the earliest forms of colorectal cancer.  Routine screening usually begins at age 50.  Direct examination of the colon should be repeated every 5-10 years through 25 years of age. However, you may need to be screened more often if early forms of precancerous polyps or small growths are found.  Skin Cancer  Check your skin from head to toe regularly.  Tell your health care provider about any new moles or changes in moles, especially if there is a change in a mole's shape or color.  Also tell your health care provider if you have a mole that is larger than the size of a pencil eraser.  Always use sunscreen. Apply sunscreen liberally and  repeatedly throughout the day.  Protect yourself by wearing long sleeves, pants, a wide-brimmed hat, and sunglasses whenever you are outside.  Heart disease, diabetes, and high blood pressure  High blood pressure causes heart disease and increases the risk of stroke. High blood pressure is more likely to develop in: ? People who have blood pressure in the high end of the normal range (130-139/85-89 mm Hg). ? People who are overweight or obese. ? People who are African American.  If you are 87-47 years of age, have your blood pressure checked every 3-5 years. If you are 46 years of age or older, have your blood pressure checked every year. You should have your blood pressure measured twice-once when you are at a hospital or clinic, and once when you are not at a hospital or clinic. Record the average of the two measurements. To check your blood pressure when you are not at a hospital or clinic, you can use: ? An automated blood pressure machine at a pharmacy. ? A home blood pressure monitor.  If  you are between 25 years and 51 years old, ask your health care provider if you should take aspirin to prevent strokes.  Have regular diabetes screenings. This involves taking a blood sample to check your fasting blood sugar level. ? If you are at a normal weight and have a low risk for diabetes, have this test once every three years after 25 years of age. ? If you are overweight and have a high risk for diabetes, consider being tested at a younger age or more often. Preventing infection Hepatitis B  If you have a higher risk for hepatitis B, you should be screened for this virus. You are considered at high risk for hepatitis B if: ? You were born in a country where hepatitis B is common. Ask your health care provider which countries are considered high risk. ? Your parents were born in a high-risk country, and you have not been immunized against hepatitis B (hepatitis B vaccine). ? You have HIV or AIDS. ? You use needles to inject street drugs. ? You live with someone who has hepatitis B. ? You have had sex with someone who has hepatitis B. ? You get hemodialysis treatment. ? You take certain medicines for conditions, including cancer, organ transplantation, and autoimmune conditions.  Hepatitis C  Blood testing is recommended for: ? Everyone born from [birth year] through 1963. ? Anyone with known risk factors for hepatitis C.  Sexually transmitted infections (STIs)  You should be screened for sexually transmitted infections (STIs) including gonorrhea and chlamydia if: ? You are sexually active and are younger than 25 years of age. ? You are older than 25 years of age and your health care provider tells you that you are at risk for this type of infection. ? Your sexual activity has changed since you were last screened and you are at an increased risk for chlamydia or gonorrhea. Ask your health care provider if you are at risk.  If you do not have HIV, but are at risk, it may be recommended  that you take a prescription medicine daily to prevent HIV infection. This is called pre-exposure prophylaxis (PrEP). You are considered at risk if: ? You are sexually active and do not regularly use condoms or know the HIV status of your partner(s). ? You take drugs by injection. ? You are sexually active with a partner who has HIV.  Talk with your health care provider about whether you  are at high risk of being infected with HIV. If you choose to begin PrEP, you should first be tested for HIV. You should then be tested every 3 months for as long as you are taking PrEP. Pregnancy  If you are premenopausal and you may become pregnant, ask your health care provider about preconception counseling.  If you may become pregnant, take 400 to 800 micrograms (mcg) of folic acid every day.  If you want to prevent pregnancy, talk to your health care provider about birth control (contraception). Osteoporosis and menopause  Osteoporosis is a disease in which the bones lose minerals and strength with aging. This can result in serious bone fractures. Your risk for osteoporosis can be identified using a bone density scan.  If you are 8 years of age or older, or if you are at risk for osteoporosis and fractures, ask your health care provider if you should be screened.  Ask your health care provider whether you should take a calcium or vitamin D supplement to lower your risk for osteoporosis.  Menopause may have certain physical symptoms and risks.  Hormone replacement therapy may reduce some of these symptoms and risks. Talk to your health care provider about whether hormone replacement therapy is right for you. Follow these instructions at home:  Schedule regular health, dental, and eye exams.  Stay current with your immunizations.  Do not use any tobacco products including cigarettes, chewing tobacco, or electronic cigarettes.  If you are pregnant, do not drink alcohol.  If you are  breastfeeding, limit how much and how often you drink alcohol.  Limit alcohol intake to no more than 1 drink per day for nonpregnant women. One drink equals 12 ounces of beer, 5 ounces of wine, or 1 ounces of hard liquor.  Do not use street drugs.  Do not share needles.  Ask your health care provider for help if you need support or information about quitting drugs.  Tell your health care provider if you often feel depressed.  Tell your health care provider if you have ever been abused or do not feel safe at home. This information is not intended to replace advice given to you by your health care provider. Make sure you discuss any questions you have with your health care provider. Document Released: 10/24/2010 Document Revised: 09/16/2015 Document Reviewed: 01/12/2015 Elsevier Interactive Patient Education  2018 [employer].   IF you received an x-ray today, you will receive an invoice from [facility]. Please contact [facility] at [phone] with questions or concerns regarding your invoice.   IF you received labwork today, you will receive an invoice from [place]. Please contact [facility] at [phone] with questions or concerns regarding your invoice.   Our billing staff will not be able to assist you with questions regarding bills from these companies.  You will be contacted with the lab results as soon as they are available. The fastest way to get your results is to activate your My Chart account. Instructions are located on the last page of this paperwork. If you have not heard from [place] regarding the results in 2 weeks, please contact this office.

## 2017-10-17 NOTE — Progress Notes (Signed)
[facility] [place], [place] [address] [phone] [phone]  Date:  10/17/2017   Name:  Barbara Ramos   DOB:  09-09-1990   MRN:  [id number]  PCP:  Patient, No Pcp Per    Chief Complaint: New pt (to estab care, no other symptoms, (overdue health maint discussed w/pt))   History of Present Illness:  This is a 27 y.o. female with PMH allergies, anxiety and migraines who is presenting for CPE. She is from [place].    Complaints:  none LMP: 10/11/2017 Contraception: none. She got "kinda sick from the depo shot". She and her fiance want to have a baby.  Last pap: 05/2013, negative.  Sexual history: active with one partner only. She was treated for trichomonas last week.  Immunizations: utd Dentist: not regularly.  Eye: she has appt with eye doctor today.  Diet/Exercise: she exercises regularly. Likes Zumba.  Fam hx: all healthy.  Tobacco/alcohol/substance use:  No drug use, does not drink. She eats veggies, fish and some meat.  Former Smoker   Quit 09/23/2017    Review of Systems:  Review of Systems  Constitutional: Negative for chills, diaphoresis, fatigue and fever.  HENT: Negative for congestion, postnasal drip, rhinorrhea, sinus pressure, sneezing and sore throat.   Respiratory: Negative for cough, chest tightness, shortness of breath and wheezing.   Cardiovascular: Negative for chest pain and palpitations.  Gastrointestinal: Negative for abdominal pain, diarrhea, nausea and vomiting.  Genitourinary: Negative for decreased urine volume, difficulty urinating, dysuria, enuresis, flank pain, frequency, hematuria and urgency.  Musculoskeletal: Negative for back pain.  Neurological: Negative for dizziness, weakness, light-headedness and headaches.    There are no active problems to display for this patient.   Prior to Admission medications   Not on File    Allergies  Allergen Reactions  . Ibuprofen     "triggers headaches"    No past surgical history on  file.  Social History   Tobacco Use  . Smoking status: Former Smoker    Packs/day: 0.01    Types: Cigarettes    Last attempt to quit: 09/23/2017    Years since quitting: 0.0  . Smokeless tobacco: Never Used  Substance Use Topics  . Alcohol use: No  . Drug use: No    Family History  Problem Relation Age of Onset  . Healthy Mother     Medication list has been reviewed and updated.  Physical Examination:  Physical Exam  Constitutional: She is oriented to person, place, and time. She appears well-developed and well-nourished. No distress.  HENT:  Head: Normocephalic and atraumatic.  Mouth/Throat: Oropharynx is clear and moist.  Eyes: Pupils are equal, round, and reactive to light. Conjunctivae and EOM are normal.  Neck: Normal range of motion. Neck supple. No thyromegaly present.  Cardiovascular: Normal rate, regular rhythm and normal heart sounds.  No murmur heard. Pulmonary/Chest: Effort normal and breath sounds normal. She has no wheezes.  Abdominal: Soft. Bowel sounds are normal. There is no tenderness.  Genitourinary: Vagina normal. Cervix exhibits no motion tenderness and no discharge. Right adnexum displays no mass and no tenderness. Left adnexum displays no mass and no tenderness.  Musculoskeletal: Normal range of motion.  Neurological: She is alert and oriented to person, place, and time. She has normal reflexes.  Skin: Skin is warm and dry.  Psychiatric: She has a normal mood and affect. Her behavior is normal. Judgment and thought content normal.  Vitals reviewed.   BP 100/62 (BP Location: Left Arm,  Patient Position: Sitting, Cuff Size: Normal)   Pulse 79   Temp 98.8 F (37.1 C) (Oral)   Resp 16   Ht _0  (1.651 m)   Wt 152 lb 3.2 oz (69 kg)   LMP 10/11/2017   SpO2 97%   BMI 25.33 kg/m  Results for orders placed or performed in visit on 10/17/17  POCT urinalysis dipstick  Result Value Ref Range   Color, UA yellow yellow   Clarity, UA clear clear    Glucose, UA negative negative mg/dL   Bilirubin, UA negative negative   Ketones, POC UA negative negative mg/dL   Spec Grav, UA 1.015 1.010 - 1.025   Blood, UA negative negative   pH, UA 6.0 5.0 - 8.0   Protein Ur, POC negative negative mg/dL   Urobilinogen, UA 0.2 0.2 or 1.0 E.U./dL   Nitrite, UA Negative Negative   Leukocytes, UA Negative Negative    Assessment and Plan: 1. Annual physical exam -Patient presents for annual exam.  She is a new patient here.  Pap as well as routine labs done today.  Anticipatory guidance discussed.  2. Screening for endocrine, metabolic and immunity disorder - POCT urinalysis dipstick - CBC with Differential/Platelet - CMP14+EGFR - Lipid panel  3. Screening for cervical cancer - Pap IG w/ reflex to HPV when ASC-U   Mercer Pod, PA-C  [facility] at [place] 10/17/2017 12:17 PM

## 2017-10-18 LAB — CMP14+EGFR
ALT: 13 IU/L (ref 0–32)
AST: 14 IU/L (ref 0–40)
Albumin/Globulin Ratio: 1.8 (ref 1.2–2.2)
Albumin: 4.9 g/dL (ref 3.5–5.5)
Alkaline Phosphatase: 66 IU/L (ref 39–117)
BUN/Creatinine Ratio: 13 (ref 9–23)
BUN: 8 mg/dL (ref 6–20)
Bilirubin Total: 0.3 mg/dL (ref 0.0–1.2)
CO2: 22 mmol/L (ref 20–29)
Calcium: 9.2 mg/dL (ref 8.7–10.2)
Chloride: 105 mmol/L (ref 96–106)
Creatinine, Ser: 0.63 mg/dL (ref 0.57–1.00)
GFR calc Af Amer: 144 mL/min/{1.73_m2} (ref >59)
GFR calc non Af Amer: 125 mL/min/{1.73_m2} (ref >59)
Globulin, Total: 2.7 g/dL (ref 1.5–4.5)
Glucose: 88 mg/dL (ref 65–99)
Potassium: 4.4 mmol/L (ref 3.5–5.2)
Sodium: 139 mmol/L (ref 134–144)
Total Protein: 7.6 g/dL (ref 6.0–8.5)

## 2017-10-18 LAB — CBC WITH DIFFERENTIAL/PLATELET
Basophils Absolute: 0 10*3/uL (ref 0.0–0.2)
Basos: 0 %
EOS (ABSOLUTE): 0.4 10*3/uL (ref 0.0–0.4)
Eos: 5 %
Hematocrit: 40.6 % (ref 34.0–46.6)
Hemoglobin: 13.5 g/dL (ref 11.1–15.9)
Immature Grans (Abs): 0 10*3/uL (ref 0.0–0.1)
Immature Granulocytes: 0 %
Lymphocytes Absolute: 2.7 10*3/uL (ref 0.7–3.1)
Lymphs: 35 %
MCH: 29.3 pg (ref 26.6–33.0)
MCHC: 33.3 g/dL (ref 31.5–35.7)
MCV: 88 fL (ref 79–97)
Monocytes Absolute: 0.5 10*3/uL (ref 0.1–0.9)
Monocytes: 6 %
Neutrophils Absolute: 4.1 10*3/uL (ref 1.4–7.0)
Neutrophils: 54 %
Platelets: 318 10*3/uL (ref 150–450)
RBC: 4.6 x10E6/uL (ref 3.77–5.28)
RDW: 14.2 % (ref 12.3–15.4)
WBC: 7.6 10*3/uL (ref 3.4–10.8)

## 2017-10-18 LAB — LIPID PANEL
Chol/HDL Ratio: 2.8 ratio (ref 0.0–4.4)
Cholesterol, Total: 176 mg/dL (ref 100–199)
HDL: 64 mg/dL (ref >39)
LDL Calculated: 95 mg/dL (ref 0–99)
Triglycerides: 83 mg/dL (ref 0–149)
VLDL Cholesterol Cal: 17 mg/dL (ref 5–40)

## 2017-10-24 LAB — PAP IG W/ RFLX HPV ASCU: PAP Smear Comment: 0

## 2017-10-24 LAB — HPV DNA PROBE HIGH RISK, AMPLIFIED: HPV, high-risk: NEGATIVE

## 2017-11-01 NOTE — Progress Notes (Signed)
PAP +ASCUS, negative HPV. Next PAP in 3 years. Results released to mychart.

## 2017-11-28 ENCOUNTER — Encounter: Payer: Self-pay | Admitting: Physician Assistant

## 2017-11-28 ENCOUNTER — Ambulatory Visit: Payer: PRIVATE HEALTH INSURANCE | Admitting: Physician Assistant

## 2017-11-28 VITALS — BP 117/74 | HR 89 | Temp 97.9°F | Resp 17 | Ht 66.0 in | Wt 152.0 lb

## 2017-11-28 DIAGNOSIS — Z3200 Encounter for pregnancy test, result unknown: Secondary | ICD-10-CM

## 2017-11-28 DIAGNOSIS — Z3201 Encounter for pregnancy test, result positive: Secondary | ICD-10-CM | POA: Diagnosis not present

## 2017-11-28 DIAGNOSIS — N926 Irregular menstruation, unspecified: Secondary | ICD-10-CM

## 2017-11-28 LAB — POCT URINE PREGNANCY: Preg Test, Ur: POSITIVE — AB

## 2017-11-28 NOTE — Patient Instructions (Addendum)
Your pregnancy test is positive. Congratulations!    Take a Prenatal Vitamin each day. Take 1 mg Folic acid daily. You can purchase one over-the-counter. These can sometimes cause nausea and constipation. If you do not tolerate them, you may take a children's multi-vitamin tablet daily until you can discuss other options with your obstetrician.  Drink at least 64 ounces of water each day. I recommend that you obtain a 32 ounce water bottle. Fill it each morning, and drink that amount by lunch time. Refill the bottle and drink that amount by supper. This will help you get enough water, and reduce the frequency you get up during the night to urinate.  Call for an appointment with the Obstetrician.  You have many choices in the [place] area: [facility] for [employer] [facility] farm [facility]) [place] [phone] [facility] [place]) [phone] [place] [facility] [place] [phone] [facility] [place] [facility] [phone] [facility]) [phone] [facility] [phone] [facility] for [place] [phone]  Get a book to read that will tell you what's happening during the different stages of your pregnancy.  You may want to consider one of these: Your Pregnancy Week by Week by [facility] What to Expect When You're Expecting by [facility], Eyvonne Mechanic and Norman Clay The Complete Pregnancy Workbook by Autumn Patty, MD, Meda Klinefelter, MPH and Norva Riffle, MD  Thank you for coming in today. I hope you feel we met your needs.  Feel free to call PCP if you have any questions or further requests.  Please consider signing up for MyChart if you do not already have it, as this is a great way to communicate with me.  Best,  Whitney McVey,  PA-C   IF you received an x-ray today, you will receive an invoice from [facility]. Please contact [facility] at [phone] with questions or concerns regarding your invoice.   IF you received labwork today, you will receive an invoice from [place]. Please contact [facility] at [phone] with questions or concerns regarding your invoice.   Our billing staff will not be able to assist you with questions regarding bills from these companies.  You will be contacted with the lab results as soon as they are available. The fastest way to get your results is to activate your My Chart account. Instructions are located on the last page of this paperwork. If you have not heard from [place] regarding the results in 2 weeks, please contact this office.

## 2017-11-28 NOTE — Progress Notes (Signed)
   Barbara SalkDawne Ramos  MRN: [id number] DOB: 1990/08/19  PCP: Patient, No Pcp Per  Subjective:  Pt is a 27 year old female who presents to clinic for pregnancy test.  She and her fiance want to have a baby. She had 2 positive home pregnancy tests.  LMP June 23. She is not taking folic acid at this time.  She does not have OBGYN. She has two children at home, ages 766 and 443.   ROS below.   Review of Systems  Constitutional: Negative for chills and fever.  Gastrointestinal: Negative for abdominal pain, nausea and vomiting.  Genitourinary: Positive for menstrual problem (missed period). Negative for pelvic pain, vaginal bleeding, vaginal discharge and vaginal pain.    There are no active problems to display for this patient.   No current outpatient medications on file prior to visit.   No current facility-administered medications on file prior to visit.     Allergies  Allergen Reactions  . Ibuprofen     "triggers headaches"     Objective:  BP 117/74   Pulse 89   Temp 97.9 F (36.6 C) (Oral)   Resp 17   Ht 5\' 6"  (1.676 m)   Wt 152 lb (68.9 kg)   LMP 10/14/2017 (Approximate)   SpO2 98%   BMI 24.53 kg/m   Physical Exam  Constitutional: She is oriented to person, place, and time. No distress.  Cardiovascular: Normal rate, regular rhythm and normal heart sounds.  Neurological: She is alert and oriented to person, place, and time.  Skin: Skin is warm and dry.  Psychiatric: Judgment normal.  Vitals reviewed.  Results for orders placed or performed in visit on 11/28/17  POCT urine pregnancy  Result Value Ref Range   Preg Test, Ur Positive (A) Negative    Assessment and Plan :  1. Positive urine pregnancy test 2. Possible pregnancy 3. Missed period - Pt presents requesting pregnancy test. LMP June 23 with 2 positive home pregnancy tests. POCT urine pregnancy is positive. She is exited about today's results. No red flags for ectopic. Discussed prenatal care, including folic  acid supplement. OBGYN contact information provided.  - POCT urine pregnancy   Marco CollieWhitney Brylynn Hanssen, PA-C  [facility] 11/28/2017 5:01 PM  Please note: Portions of this report may have been transcribed using dragon voice recognition software. Every effort was made to ensure accuracy; however, inadvertent computerized transcription errors may be present.

## 2018-01-01 ENCOUNTER — Telehealth: Payer: Self-pay | Admitting: *Deleted

## 2018-01-01 NOTE — Telephone Encounter (Signed)
Attempted to call patient to reschedule Missed New Ob appt or find out her plans and patient phone rang back a fast busy and unable to leave a message.Barbara Ramos

## 2018-02-16 ENCOUNTER — Emergency Department (HOSPITAL_COMMUNITY)
Admission: EM | Admit: 2018-02-16 | Discharge: 2018-02-17 | Disposition: A | Discharge status: Home / Self Care | Payer: PRIVATE HEALTH INSURANCE | Attending: Emergency Medicine | Admitting: Emergency Medicine

## 2018-02-16 ENCOUNTER — Encounter (HOSPITAL_COMMUNITY): Payer: Self-pay | Admitting: Emergency Medicine

## 2018-02-16 ENCOUNTER — Other Ambulatory Visit: Payer: Self-pay

## 2018-02-16 DIAGNOSIS — Z87891 Personal history of nicotine dependence: Secondary | ICD-10-CM | POA: Diagnosis not present

## 2018-02-16 DIAGNOSIS — Z3A16 16 weeks gestation of pregnancy: Secondary | ICD-10-CM | POA: Insufficient documentation

## 2018-02-16 DIAGNOSIS — O23592 Infection of other part of genital tract in pregnancy, second trimester: Secondary | ICD-10-CM | POA: Diagnosis not present

## 2018-02-16 DIAGNOSIS — O209 Hemorrhage in early pregnancy, unspecified: Secondary | ICD-10-CM | POA: Diagnosis present

## 2018-02-16 DIAGNOSIS — N930 Postcoital and contact bleeding: Secondary | ICD-10-CM

## 2018-02-16 DIAGNOSIS — O4690 Antepartum hemorrhage, unspecified, unspecified trimester: Secondary | ICD-10-CM

## 2018-02-16 DIAGNOSIS — O23599 Infection of other part of genital tract in pregnancy, unspecified trimester: Secondary | ICD-10-CM

## 2018-02-16 DIAGNOSIS — B9689 Other specified bacterial agents as the cause of diseases classified elsewhere: Secondary | ICD-10-CM

## 2018-02-16 LAB — COMPREHENSIVE METABOLIC PANEL
ALK PHOS: 48 U/L (ref 38–126)
ALT: 15 U/L (ref 0–44)
ANION GAP: 7 (ref 5–15)
AST: 16 U/L (ref 15–41)
Albumin: 3.2 g/dL — ABNORMAL LOW (ref 3.5–5.0)
BUN: 5 mg/dL — ABNORMAL LOW (ref 6–20)
CALCIUM: 8.4 mg/dL — AB (ref 8.9–10.3)
CO2: 22 mmol/L (ref 22–32)
Chloride: 108 mmol/L (ref 98–111)
Creatinine, Ser: 0.88 mg/dL (ref 0.44–1.00)
GFR calc non Af Amer: >60 mL/min (ref >60)
Glucose, Bld: 104 mg/dL — ABNORMAL HIGH (ref 70–99)
POTASSIUM: 3.8 mmol/L (ref 3.5–5.1)
SODIUM: 137 mmol/L (ref 135–145)
Total Bilirubin: 0.2 mg/dL — ABNORMAL LOW (ref 0.3–1.2)
Total Protein: 6 g/dL — ABNORMAL LOW (ref 6.5–8.1)

## 2018-02-16 LAB — CBC
HCT: 32 % — ABNORMAL LOW (ref 36.0–46.0)
HEMOGLOBIN: 10.6 g/dL — AB (ref 12.0–15.0)
MCH: 28.4 pg (ref 26.0–34.0)
MCHC: 33.1 g/dL (ref 30.0–36.0)
MCV: 85.8 fL (ref 80.0–100.0)
NRBC: 0 % (ref 0.0–0.2)
PLATELETS: 273 10*3/uL (ref 150–400)
RBC: 3.73 MIL/uL — AB (ref 3.87–5.11)
RDW: 12.3 % (ref 11.5–15.5)
WBC: 8.1 10*3/uL (ref 4.0–10.5)

## 2018-02-16 LAB — HCG, QUANTITATIVE, PREGNANCY: HCG, BETA CHAIN, QUANT, S: 12803 m[IU]/mL — AB (ref <5)

## 2018-02-16 NOTE — ED Triage Notes (Signed)
Pt reports vaginal bleeding and pain after sexual intercourse today. No bleeding at this time. Denies abdominal pain. Reports she is [redacted]weeks pregnant.

## 2018-02-17 ENCOUNTER — Emergency Department (HOSPITAL_COMMUNITY): Payer: PRIVATE HEALTH INSURANCE

## 2018-02-17 LAB — ABO/RH: ABO/RH(D): O POS

## 2018-02-17 LAB — URINALYSIS, ROUTINE W REFLEX MICROSCOPIC
Bilirubin Urine: NEGATIVE
GLUCOSE, UA: NEGATIVE mg/dL
Hgb urine dipstick: NEGATIVE
Ketones, ur: NEGATIVE mg/dL
LEUKOCYTES UA: NEGATIVE
Nitrite: NEGATIVE
PH: 6 (ref 5.0–8.0)
Protein, ur: NEGATIVE mg/dL
SPECIFIC GRAVITY, URINE: 1.014 (ref 1.005–1.030)

## 2018-02-17 LAB — WET PREP, GENITAL
SPERM: NONE SEEN
Trich, Wet Prep: NONE SEEN
Yeast Wet Prep HPF POC: NONE SEEN

## 2018-02-17 MED ORDER — METRONIDAZOLE 500 MG PO TABS: 500.0000 mg | ORAL_TABLET | Freq: Two times a day (BID) | ORAL | 0 refills | Status: DC

## 2018-02-17 MED ORDER — METRONIDAZOLE 500 MG PO TABS
500.0000 mg | ORAL_TABLET | Freq: Once | ORAL | Status: AC
  Administered 2018-02-17: 500 mg via ORAL
  Filled 2018-02-17: qty 1

## 2018-02-17 NOTE — ED Provider Notes (Signed)
[facility] EMERGENCY DEPARTMENT Provider Note   CSN: [id number] Arrival date & time: 02/16/18  1811     History   Chief Complaint Chief Complaint  Patient presents with  . Vaginal Bleeding    HPI Barbara Ramos is a 27 y.o. female.  The history is provided by the patient and medical records.     27 year old G3P2 approx [redacted] weeks gestation with history of seasonal allergies, anxiety, migraine headaches, presenting to the ED for vaginal bleeding.  Reports she had intercourse this morning and afterwards had a fair amount of bright red vaginal bleeding.  She denies any passage of lots or membranous tissue.  States this was brief and has resolved completely.  She reports some abdominal cramping and pain in her low back.  She denies any urinary symptoms.  She does report some vaginal itching but no noted discharge.  States she has a history of recurrent yeast infections during prior pregnancies.  States her initial visit was with her primary care doctor, however has not been evaluated at [facility] hospital or had ultrasound thus far.  She has been taking prenatal vitamins.  No nausea/vomiting.  Unsure of blood type.  Past Medical History:  Diagnosis Date  . Allergy   . Anxiety   . Migraines     Patient Active Problem List   Diagnosis Date Noted  . Positive urine pregnancy test 11/28/2017    History reviewed. No pertinent surgical history.   OB History    Gravida  1   Para      Term      Preterm      AB      Living        SAB      TAB      Ectopic      Multiple      Live Births               Home Medications    Prior to Admission medications   Not on File    Family History Family History  Problem Relation Age of Onset  . Healthy Mother     Social History Social History   Tobacco Use  . Smoking status: Former Smoker    Packs/day: 0.01    Types: Cigarettes    Last attempt to quit: 09/23/2017    Years since quitting: 0.4  .  Smokeless tobacco: Never Used  Substance Use Topics  . Alcohol use: No  . Drug use: No     Allergies   Ibuprofen   Review of Systems Review of Systems  Genitourinary: Positive for vaginal bleeding.  All other systems reviewed and are negative.    Physical Exam Updated Vital Signs BP 112/70 (BP Location: Right Arm)   Pulse 72   Temp 98.4 F (36.9 C) (Oral)   Resp 14   LMP 10/16/2017 (Exact Date)   SpO2 99%   Physical Exam  Constitutional: She is oriented to person, place, and time. She appears well-developed and well-nourished.  HENT:  Head: Normocephalic and atraumatic.  Mouth/Throat: Oropharynx is clear and moist.  Eyes: Pupils are equal, round, and reactive to light. Conjunctivae and EOM are normal.  Neck: Normal range of motion.  Cardiovascular: Normal rate, regular rhythm and normal heart sounds.  Pulmonary/Chest: Effort normal and breath sounds normal. No stridor. No respiratory distress.  Abdominal: Soft. Bowel sounds are normal. There is no tenderness. There is no rebound.  Gravid, non-tender  Genitourinary:  Genitourinary Comments: Exam  chaperoned by NT Normal female external genitalia without visible lesions or rash; small amount of thick, white vaginal discharge; no bleeding; cervical os closed  Musculoskeletal: Normal range of motion.  Neurological: She is alert and oriented to person, place, and time.  Skin: Skin is warm and dry.  Psychiatric: She has a normal mood and affect.  Nursing note and vitals reviewed.    ED Treatments / Results  Labs (all labs ordered are listed, but only abnormal results are displayed) Labs Reviewed  WET PREP, GENITAL - Abnormal; Notable for the following components:      Result Value   Clue Cells Wet Prep HPF POC PRESENT (*)    WBC, Wet Prep HPF POC MANY (*)    All other components within normal limits  HCG, QUANTITATIVE, PREGNANCY - Abnormal; Notable for the following components:   hCG, Beta Chain, Quant, S 12,803  (*)    All other components within normal limits  COMPREHENSIVE METABOLIC PANEL - Abnormal; Notable for the following components:   Glucose, Bld 104 (*)    BUN 5 (*)    Calcium 8.4 (*)    Total Protein 6.0 (*)    Albumin 3.2 (*)    Total Bilirubin 0.2 (*)    All other components within normal limits  CBC - Abnormal; Notable for the following components:   RBC 3.73 (*)    Hemoglobin 10.6 (*)    HCT 32.0 (*)    All other components within normal limits  URINALYSIS, ROUTINE W REFLEX MICROSCOPIC  ABO/RH  GC/CHLAMYDIA PROBE AMP ([place]) NOT AT [facility]    EKG None  Radiology US Ob Limited  Result Date: 02/17/2018 CLINICAL DATA:  27 year old pregnant female with vaginal bleeding after intercourse. LMP: 10/16/2017 corresponding to an estimated gestational age of [redacted] weeks, 5 days. EXAM: LIMITED OBSTETRIC ULTRASOUND FINDINGS: Number of Fetuses: 1 Heart Rate:  135 bpm Movement: Detected Presentation: Cephalic Placental Location: Anterior Previa: No Amniotic Fluid (Subjective):  Within normal limits. BPD: 4.3 cm 19 w  0 d MATERNAL FINDINGS: Cervix:  Appears closed. Uterus/Adnexae: No abnormality visualized. IMPRESSION: Single live intrauterine pregnancy.  No acute findings. This exam is performed on an emergent basis and does not comprehensively evaluate fetal size, dating, or anatomy; follow-up complete OB US should be considered if further fetal assessment is warranted. Electronically Signed   By: Elgie Collard M.D.   On: 02/17/2018 02:26    Procedures Procedures (including critical care time)  Medications Ordered in ED Medications - No data to display   Initial Impression / Assessment and Plan / ED Course  I have reviewed the triage vital signs and the nursing notes.  Pertinent labs & imaging results that were available during my care of the patient were reviewed by me and considered in my medical decision making (see chart for details).  27 year old female here with vaginal  bleeding.  Estimates she is approximately 16 to [redacted] weeks pregnant.  She has not had any ultrasound thus far.  She reports some vaginal bleeding this morning after intercourse.  This was brief and has resolved completely at this time.  She denies any pelvic or abdominal pain but does report some vaginal itching.  That she has history of yeast infections with her prior pregnancies.  She is afebrile and nontoxic.  Gravid abdomen but soft and nontender.  Labs overall reassuring.  Pelvic exam performed, small amount of thick, white vaginal discharge but no appreciable bleeding.  Cervical os is closed.  Wet prep  with clue cells. Gc/chl pending.  US obtained, single live IUP without any noted complications.  Will treat with course of flagyl.  Continue prenatal vitamins.  She has appt with OB-GYN on 02/21/18.  She will return here or women's hospital for any new/acute changes.  Final Clinical Impressions(s) / ED Diagnoses   Final diagnoses:  Vaginal bleeding in pregnancy  Bacterial vaginosis in pregnancy    ED Discharge Orders         Ordered    metroNIDAZOLE (FLAGYL) 500 MG tablet  2 times daily     02/17/18 0331           Garlon Hatchet, PA-C 02/17/18 1610    Rolan Bucco, MD 02/17/18 [phone]

## 2018-02-17 NOTE — Discharge Instructions (Signed)
Take the prescribed medication as directed. Follow-up with OB-GYN on 02/21/18 as scheduled. Return to the ED for new or worsening symptoms.

## 2018-02-19 ENCOUNTER — Telehealth: Payer: Self-pay | Admitting: Student

## 2018-02-19 DIAGNOSIS — A749 Chlamydial infection, unspecified: Secondary | ICD-10-CM

## 2018-02-19 LAB — GC/CHLAMYDIA PROBE AMP (~~LOC~~) NOT AT ARMC
CHLAMYDIA, DNA PROBE: POSITIVE — AB
Neisseria Gonorrhea: NEGATIVE

## 2018-02-19 MED ORDER — AZITHROMYCIN 500 MG PO TABS: 1000.0000 mg | ORAL_TABLET | Freq: Once | ORAL | 0 refills | Status: AC

## 2018-02-19 NOTE — Telephone Encounter (Addendum)
Barbara Ramos tested positive for  Chlamydia. Patient was called by RN and allergies and pharmacy confirmed. Rx sent to pharmacy of choice.   Judeth Horn, NP 02/19/2018 9:02 PM       ----- Message from Kathe Becton, RN sent at 02/19/2018  5:33 PM EDT ----- This patient tested positive for : Chlamydia  She "has NKDA", I have informed the patient of her results and confirmed her pharmacy is correct in her chart. Please send Rx.   Thank you,   Kathe Becton, RN   Results faxed to [facility] Department.

## 2018-02-21 ENCOUNTER — Encounter: Payer: Self-pay | Admitting: Certified Nurse Midwife

## 2018-02-21 ENCOUNTER — Ambulatory Visit (INDEPENDENT_AMBULATORY_CARE_PROVIDER_SITE_OTHER): Payer: PRIVATE HEALTH INSURANCE | Admitting: Certified Nurse Midwife

## 2018-02-21 VITALS — BP 105/59 | HR 86 | Wt 157.0 lb

## 2018-02-21 DIAGNOSIS — Z3480 Encounter for supervision of other normal pregnancy, unspecified trimester: Secondary | ICD-10-CM

## 2018-02-21 DIAGNOSIS — O98812 Other maternal infectious and parasitic diseases complicating pregnancy, second trimester: Secondary | ICD-10-CM

## 2018-02-21 DIAGNOSIS — A749 Chlamydial infection, unspecified: Secondary | ICD-10-CM

## 2018-02-21 DIAGNOSIS — O98819 Other maternal infectious and parasitic diseases complicating pregnancy, unspecified trimester: Secondary | ICD-10-CM

## 2018-02-21 DIAGNOSIS — Z3482 Encounter for supervision of other normal pregnancy, second trimester: Secondary | ICD-10-CM

## 2018-02-21 DIAGNOSIS — O0932 Supervision of pregnancy with insufficient antenatal care, second trimester: Secondary | ICD-10-CM

## 2018-02-21 NOTE — Patient Instructions (Signed)
Second Trimester of Pregnancy The second trimester is from week 13 through week 28, month 4 through 6. This is often the time in pregnancy that you feel your best. Often times, morning sickness has lessened or quit. You may have more energy, and you may get hungry more often. Your unborn baby (fetus) is growing rapidly. At the end of the sixth month, he or she is about 9 inches long and weighs about 1 pounds. You will likely feel the baby move (quickening) between 18 and 20 weeks of pregnancy. Follow these instructions at home:  Avoid all smoking, herbs, and alcohol. Avoid drugs not approved by your doctor.  Do not use any tobacco products, including cigarettes, chewing tobacco, and electronic cigarettes. If you need help quitting, ask your doctor. You may get counseling or other support to help you quit.  Only take medicine as told by your doctor. Some medicines are safe and some are not during pregnancy.  Exercise only as told by your doctor. Stop exercising if you start having cramps.  Eat regular, healthy meals.  Wear a good support bra if your breasts are tender.  Do not use hot tubs, steam rooms, or saunas.  Wear your seat belt when driving.  Avoid raw meat, uncooked cheese, and liter boxes and soil used by cats.  Take your prenatal vitamins.  Take 1500-2000 milligrams of calcium daily starting at the 20th week of pregnancy until you deliver your baby.  Try taking medicine that helps you poop (stool softener) as needed, and if your doctor approves. Eat more fiber by eating fresh fruit, vegetables, and whole grains. Drink enough fluids to keep your pee (urine) clear or pale yellow.  Take warm water baths (sitz baths) to soothe pain or discomfort caused by hemorrhoids. Use hemorrhoid cream if your doctor approves.  If you have puffy, bulging veins (varicose veins), wear support hose. Raise (elevate) your feet for 15 minutes, 3-4 times a day. Limit salt in your diet.  Avoid heavy  lifting, wear low heals, and sit up straight.  Rest with your legs raised if you have leg cramps or low back pain.  Visit your dentist if you have not gone during your pregnancy. Use a soft toothbrush to brush your teeth. Be gentle when you floss.  You can have sex (intercourse) unless your doctor tells you not to.  Go to your doctor visits. Get help if:  You feel dizzy.  You have mild cramps or pressure in your lower belly (abdomen).  You have a nagging pain in your belly area.  You continue to feel sick to your stomach (nauseous), throw up (vomit), or have watery poop (diarrhea).  You have bad smelling fluid coming from your vagina.  You have pain with peeing (urination). Get help right away if:  You have a fever.  You are leaking fluid from your vagina.  You have spotting or bleeding from your vagina.  You have severe belly cramping or pain.  You lose or gain weight rapidly.  You have trouble catching your breath and have chest pain.  You notice sudden or extreme puffiness (swelling) of your face, hands, ankles, feet, or legs.  You have not felt the baby move in over an hour.  You have severe headaches that do not go away with medicine.  You have vision changes. This information is not intended to replace advice given to you by your health care provider. Make sure you discuss any questions you have with your health care   provider. Document Released: 07/05/2009 Document Revised: 09/16/2015 Document Reviewed: 06/11/2012 Elsevier Interactive Patient Education  2017 Elsevier Inc.  

## 2018-02-21 NOTE — Progress Notes (Signed)
Subjective:   Barbara Ramos is a 27 y.o. G3P2002 at [redacted]w[redacted]d by LMP being seen today for her first obstetrical visit.  Her obstetrical history is significant for nothing. Patient does intend to breast feed. Pregnancy history fully reviewed.  Patient reports no complaints.  HISTORY: OB History  Gravida Para Term Preterm AB Living  3 2 2  0 0 2  SAB TAB Ectopic Multiple Live Births  0 0 0 0 2    # Outcome Date GA Lbr Len/2nd Weight Sex Delivery Anes PTL Lv  3 Current           2 Term 03/28/14    M Vag-Spont   LIV  1 Term 12/27/10    F Vag-Spont   LIV    Last pap smear was done 09/2017 and was abnormal - ASCUS, needs repeat PP   Past Medical History:  Diagnosis Date  . Allergy   . Anxiety   . Migraines    Past Surgical History:  Procedure Laterality Date  . NO PAST SURGERIES     Family History  Problem Relation Age of Onset  . Healthy Mother    Social History   Tobacco Use  . Smoking status: Former Smoker    Packs/day: 0.01    Types: Cigarettes    Last attempt to quit: 09/23/2017    Years since quitting: 0.4  . Smokeless tobacco: Never Used  Substance Use Topics  . Alcohol use: No  . Drug use: No   Allergies  Allergen Reactions  . Ibuprofen     "triggers headaches"   Current Outpatient Medications on File Prior to Visit  Medication Sig Dispense Refill  . folic acid (FOLVITE) 1 MG tablet Take 1 mg by mouth daily.    . metroNIDAZOLE (FLAGYL) 500 MG tablet Take 1 tablet (500 mg total) by mouth 2 (two) times daily. 14 tablet 0  . prenatal vitamin w/FE, FA (PRENATAL 1 + 1) 27-1 MG TABS tablet Take 1 tablet by mouth daily at 12 noon.     No current facility-administered medications on file prior to visit.     Review of Systems Pertinent items noted in HPI and remainder of comprehensive ROS otherwise negative.  Exam   Vitals:   02/21/18 1514  BP: (!) 105/59  Pulse: 86  Weight: 157 lb (71.2 kg)   Fetal Heart Rate (bpm): 145  Uterus:   Fundus at umbilicus     System: General: well-developed, well-nourished female in no acute distress   Skin: normal coloration and turgor, no rashes   Neurologic: oriented, normal, negative, normal mood   Extremities: normal strength, tone, and muscle mass, ROM of all joints is normal   HEENT PERRLA, extraocular movement intact and sclera clear   Mouth/Teeth mucous membranes moist, pharynx normal without lesions and dental hygiene good   Neck supple and no masses   Cardiovascular: regular rate and rhythm   Respiratory:  no respiratory distress, normal breath sounds   Abdomen: soft, non-tender; bowel sounds normal; no masses,  no organomegaly, gravid appropriate for gestational age     Assessment:   Pregnancy: [id number] Patient Active Problem List   Diagnosis Date Noted  . Supervision of other normal pregnancy, antepartum 02/21/2018  . Chlamydia infection during pregnancy 02/21/2018  . Late prenatal care affecting pregnancy, antepartum, second trimester 02/21/2018  . Positive urine pregnancy test 11/28/2017     Plan:  1. Supervision of other normal pregnancy, antepartum - Patient doing well, no complaints  -  Denies vaginal bleeding currently  - Obstetric Panel, Including HIV - Hemoglobinopathy evaluation - Culture, OB Urine - Cystic Fibrosis Mutation 97 - SMN1 Copy Number Analysis - [place] MFM OB COMP + 14 WK; Future - Genetic Screening - AFP, Serum, Open Spina Bifida  2. Chlamydia infection during pregnancy - Was seen in the ED on 10/27 for vaginal bleeding after intercourse, noted to have Chlamydia infection  - Patient reports she has not taken medication, plans to pick up medication today after visit  - Educated and discussed importance of partner needing to be treated and to abstain from intercourse for 10 days after both her and partner are treated, patient verbalizes understanding   3. Late prenatal care affecting pregnancy, antepartum, second trimester - Prenatal care initiated at 18 weeks     Initial labs drawn. Continue prenatal vitamins. Genetic Screening discussed, AFP and NIPS: ordered. Ultrasound discussed; fetal anatomic survey: ordered. Problem list reviewed and updated. The nature of [facility] with multiple MDs and other Advanced Practice Providers was explained to patient; also emphasized that residents, students are part of our team. Routine obstetric precautions reviewed. Return in about 4 weeks (around 03/21/2018) for ROB.    Sharyon Cable, CNM Center for [employer], [facility]

## 2018-02-23 LAB — CULTURE, OB URINE

## 2018-02-23 LAB — URINE CULTURE, OB REFLEX

## 2018-02-26 LAB — OBSTETRIC PANEL, INCLUDING HIV
Antibody Screen: NEGATIVE
Basophils Absolute: 0 10*3/uL (ref 0.0–0.2)
Basos: 0 %
EOS (ABSOLUTE): 0.2 10*3/uL (ref 0.0–0.4)
Eos: 3 %
HIV Screen 4th Generation wRfx: NONREACTIVE
Hematocrit: 30.5 % — ABNORMAL LOW (ref 34.0–46.6)
Hemoglobin: 10.4 g/dL — ABNORMAL LOW (ref 11.1–15.9)
Hepatitis B Surface Ag: NEGATIVE
Immature Grans (Abs): 0 10*3/uL (ref 0.0–0.1)
Immature Granulocytes: 0 %
Lymphocytes Absolute: 2.2 10*3/uL (ref 0.7–3.1)
Lymphs: 29 %
MCH: 28.7 pg (ref 26.6–33.0)
MCHC: 34.1 g/dL (ref 31.5–35.7)
MCV: 84 fL (ref 79–97)
Monocytes Absolute: 0.5 10*3/uL (ref 0.1–0.9)
Monocytes: 7 %
Neutrophils Absolute: 4.7 10*3/uL (ref 1.4–7.0)
Neutrophils: 61 %
Platelets: 260 10*3/uL (ref 150–450)
RBC: 3.63 x10E6/uL — ABNORMAL LOW (ref 3.77–5.28)
RDW: 13 % (ref 12.3–15.4)
RPR Ser Ql: NONREACTIVE
Rh Factor: POSITIVE
Rubella Antibodies, IGG: 3.82 index (ref >0.99)
WBC: 7.7 10*3/uL (ref 3.4–10.8)

## 2018-02-26 LAB — HEMOGLOBINOPATHY EVALUATION
HGB C: 0 %
HGB S: 38.4 % — ABNORMAL HIGH
HGB VARIANT: 0 %
Hemoglobin A2 Quantitation: 3.7 % — ABNORMAL HIGH (ref 1.8–3.2)
Hemoglobin F Quantitation: 0 % (ref 0.0–2.0)
Hgb A: 57.9 % — ABNORMAL LOW (ref 96.4–98.8)

## 2018-02-27 ENCOUNTER — Ambulatory Visit (HOSPITAL_COMMUNITY): Payer: PRIVATE HEALTH INSURANCE

## 2018-02-28 ENCOUNTER — Other Ambulatory Visit (HOSPITAL_COMMUNITY): Payer: Self-pay | Admitting: *Deleted

## 2018-02-28 ENCOUNTER — Ambulatory Visit (HOSPITAL_COMMUNITY)
Admission: RE | Admit: 2018-02-28 | Discharge: 2018-02-28 | Disposition: A | Discharge status: Home / Self Care | Payer: PRIVATE HEALTH INSURANCE | Source: Ambulatory Visit | Attending: Certified Nurse Midwife | Admitting: Certified Nurse Midwife

## 2018-02-28 DIAGNOSIS — O0932 Supervision of pregnancy with insufficient antenatal care, second trimester: Secondary | ICD-10-CM

## 2018-02-28 DIAGNOSIS — Z3A19 19 weeks gestation of pregnancy: Secondary | ICD-10-CM

## 2018-02-28 DIAGNOSIS — Z3480 Encounter for supervision of other normal pregnancy, unspecified trimester: Secondary | ICD-10-CM

## 2018-02-28 DIAGNOSIS — Z363 Encounter for antenatal screening for malformations: Secondary | ICD-10-CM | POA: Diagnosis present

## 2018-02-28 DIAGNOSIS — Z362 Encounter for other antenatal screening follow-up: Secondary | ICD-10-CM

## 2018-03-02 LAB — CYSTIC FIBROSIS MUTATION 97: Interpretation: NOT DETECTED

## 2018-03-02 LAB — AFP, SERUM, OPEN SPINA BIFIDA
AFP MoM: 1.59
AFP Value: 76.4 ng/mL
Gest. Age on Collection Date: 18.3 weeks
Maternal Age At EDD: 25.8 yr
OSBR Risk 1 IN: 4310
Test Results:: NEGATIVE
Weight: 157 [lb_av]

## 2018-03-02 LAB — SMN1 COPY NUMBER ANALYSIS (SMA CARRIER SCREENING)

## 2018-03-27 ENCOUNTER — Ambulatory Visit (INDEPENDENT_AMBULATORY_CARE_PROVIDER_SITE_OTHER): Payer: PRIVATE HEALTH INSURANCE | Admitting: Certified Nurse Midwife

## 2018-03-27 ENCOUNTER — Encounter: Payer: Self-pay | Admitting: Certified Nurse Midwife

## 2018-03-27 VITALS — BP 128/77 | HR 89 | Wt 158.8 lb

## 2018-03-27 DIAGNOSIS — N898 Other specified noninflammatory disorders of vagina: Secondary | ICD-10-CM | POA: Diagnosis not present

## 2018-03-27 DIAGNOSIS — Z3480 Encounter for supervision of other normal pregnancy, unspecified trimester: Secondary | ICD-10-CM

## 2018-03-27 DIAGNOSIS — O99019 Anemia complicating pregnancy, unspecified trimester: Secondary | ICD-10-CM

## 2018-03-27 DIAGNOSIS — O99012 Anemia complicating pregnancy, second trimester: Secondary | ICD-10-CM

## 2018-03-27 DIAGNOSIS — B373 Candidiasis of vulva and vagina: Secondary | ICD-10-CM

## 2018-03-27 DIAGNOSIS — O98812 Other maternal infectious and parasitic diseases complicating pregnancy, second trimester: Secondary | ICD-10-CM

## 2018-03-27 DIAGNOSIS — B3731 Acute candidiasis of vulva and vagina: Secondary | ICD-10-CM

## 2018-03-27 DIAGNOSIS — Z113 Encounter for screening for infections with a predominantly sexual mode of transmission: Secondary | ICD-10-CM

## 2018-03-27 DIAGNOSIS — A749 Chlamydial infection, unspecified: Secondary | ICD-10-CM

## 2018-03-27 DIAGNOSIS — O98819 Other maternal infectious and parasitic diseases complicating pregnancy, unspecified trimester: Secondary | ICD-10-CM

## 2018-03-27 DIAGNOSIS — D573 Sickle-cell trait: Secondary | ICD-10-CM

## 2018-03-27 DIAGNOSIS — Z3A23 23 weeks gestation of pregnancy: Secondary | ICD-10-CM

## 2018-03-27 DIAGNOSIS — Z3482 Encounter for supervision of other normal pregnancy, second trimester: Secondary | ICD-10-CM

## 2018-03-27 NOTE — Progress Notes (Addendum)
   PRENATAL VISIT NOTE  Subjective:  Barbara Ramos is a 27 y.o. G3P2002 at 1460w1d being seen today for ongoing prenatal care.  She is currently monitored for the following issues for this low-risk pregnancy and has Positive urine pregnancy test; Supervision of other normal pregnancy, antepartum; Chlamydia infection during pregnancy; and Late prenatal care affecting pregnancy, antepartum, second trimester on their problem list.  Patient reports no complaints.  Contractions: Not present. Vag. Bleeding: None.  Movement: Present. Denies leaking of fluid.   The following portions of the patient's history were reviewed and updated as appropriate: allergies, current medications, past family history, past medical history, past social history, past surgical history and problem list. Problem list updated.  Objective:   Vitals:   03/27/18 1034  BP: 128/77  Pulse: 89  Weight: 158 lb 12.8 oz (72 kg)    Fetal Status: Fetal Heart Rate (bpm): 150 Fundal Height: 21 cm Movement: Present     General:  Alert, oriented and cooperative. Patient is in no acute distress.  Skin: Skin is warm and dry. No rash noted.   Cardiovascular: Normal heart rate noted  Respiratory: Normal respiratory effort, no problems with respiration noted  Abdomen: Soft, gravid, appropriate for gestational age.  Pain/Pressure: Absent     Pelvic: Cervical exam deferred        Extremities: Normal range of motion.  Edema: Trace  Mental Status: Normal mood and affect. Normal behavior. Normal judgment and thought content.   Assessment and Plan:  Pregnancy: G3P2002 at 1760w1d  1. Supervision of other normal pregnancy, antepartum - Patient doing well, no complaints  - Reports occasional emesis but is not currently taking medication and does not want any medication at this time  - Anticipatory guidance on upcoming appointment with GTT. Educated on fasting prior to appointment for screening, patient verbalizes understanding   2. Chlamydia  infection during pregnancy - CH+ on 10/27, TOC completed today, patient denies additional vaginal discharge - white thick discharge at vaginal introitus  - Cervicovaginal ancillary only( [place])  3. Sickle cell trait in mother affecting pregnancy (HCC) - Patient with known sickle cell trait, reports she believes partner does not have trait or disease and other children have trait as well  - Genetic counseling; Future  Preterm labor symptoms and general obstetric precautions including but not limited to vaginal bleeding, contractions, leaking of fluid and fetal movement were reviewed in detail with the patient. Please refer to After Visit Summary for other counseling recommendations.  Return in about 4 weeks (around 04/24/2018) for ROB/2hrGTT.  Future Appointments  Date Time Provider Department Center  03/28/2018 10:30 AM [facility] US 1 [facility]  04/25/2018  8:45 AM [facility] None  04/25/2018  9:00 AM Marny LowensteinWenzel, [name] N, PA-C [facility] None    Sharyon CableVeronica C Tarik Teixeira, CNM

## 2018-03-27 NOTE — Patient Instructions (Signed)

## 2018-03-27 NOTE — Progress Notes (Signed)
Pt presents for ROB. Pt has no complaints. 

## 2018-03-28 ENCOUNTER — Ambulatory Visit (HOSPITAL_COMMUNITY)
Admission: RE | Admit: 2018-03-28 | Discharge: 2018-03-28 | Disposition: A | Discharge status: Home / Self Care | Payer: PRIVATE HEALTH INSURANCE | Source: Ambulatory Visit | Attending: Certified Nurse Midwife | Admitting: Certified Nurse Midwife

## 2018-03-28 DIAGNOSIS — Z362 Encounter for other antenatal screening follow-up: Secondary | ICD-10-CM | POA: Insufficient documentation

## 2018-03-28 DIAGNOSIS — O0932 Supervision of pregnancy with insufficient antenatal care, second trimester: Secondary | ICD-10-CM

## 2018-03-28 DIAGNOSIS — Z3A23 23 weeks gestation of pregnancy: Secondary | ICD-10-CM | POA: Diagnosis not present

## 2018-03-28 LAB — CERVICOVAGINAL ANCILLARY ONLY
Bacterial vaginitis: NEGATIVE
Candida vaginitis: POSITIVE — AB
Chlamydia: NEGATIVE
Neisseria Gonorrhea: NEGATIVE
Trichomonas: NEGATIVE

## 2018-03-29 MED ORDER — TERCONAZOLE 0.8 % VA CREA: 1.0000 | TOPICAL_CREAM | Freq: Every day | VAGINAL | 0 refills | Status: DC

## 2018-03-29 NOTE — Addendum Note (Signed)
Addended by: Sharyon CableOGERS, Amare Kontos C on: 03/29/2018 02:29 PM   Modules accepted: Orders

## 2018-04-18 ENCOUNTER — Other Ambulatory Visit: Payer: Self-pay

## 2018-04-18 ENCOUNTER — Other Ambulatory Visit: Payer: Self-pay | Admitting: Nurse Practitioner

## 2018-04-18 ENCOUNTER — Inpatient Hospital Stay (HOSPITAL_COMMUNITY)
Admission: AD | Admit: 2018-04-18 | Discharge: 2018-04-18 | Disposition: A | Discharge status: Home / Self Care | Payer: PRIVATE HEALTH INSURANCE | Attending: Obstetrics & Gynecology | Admitting: Obstetrics & Gynecology

## 2018-04-18 ENCOUNTER — Encounter (HOSPITAL_COMMUNITY): Payer: Self-pay | Admitting: *Deleted

## 2018-04-18 DIAGNOSIS — J101 Influenza due to other identified influenza virus with other respiratory manifestations: Secondary | ICD-10-CM | POA: Diagnosis not present

## 2018-04-18 DIAGNOSIS — O99512 Diseases of the respiratory system complicating pregnancy, second trimester: Secondary | ICD-10-CM | POA: Diagnosis not present

## 2018-04-18 DIAGNOSIS — Z87891 Personal history of nicotine dependence: Secondary | ICD-10-CM | POA: Insufficient documentation

## 2018-04-18 DIAGNOSIS — Z3A26 26 weeks gestation of pregnancy: Secondary | ICD-10-CM | POA: Diagnosis not present

## 2018-04-18 DIAGNOSIS — R05 Cough: Secondary | ICD-10-CM | POA: Diagnosis present

## 2018-04-18 DIAGNOSIS — J069 Acute upper respiratory infection, unspecified: Secondary | ICD-10-CM

## 2018-04-18 LAB — INFLUENZA PANEL BY PCR (TYPE A & B)
INFLAPCR: NEGATIVE
Influenza B By PCR: POSITIVE — AB

## 2018-04-18 LAB — GROUP A STREP BY PCR: GROUP A STREP BY PCR: NOT DETECTED

## 2018-04-18 MED ORDER — BENZONATATE 100 MG PO CAPS: 100.0000 mg | ORAL_CAPSULE | Freq: Three times a day (TID) | ORAL | 0 refills | Status: DC

## 2018-04-18 MED ORDER — FLUTICASONE PROPIONATE 50 MCG/ACT NA SUSP: 1.0000 | Freq: Every day | NASAL | 0 refills | Status: DC

## 2018-04-18 MED ORDER — OSELTAMIVIR PHOSPHATE 75 MG PO CAPS: 75.0000 mg | ORAL_CAPSULE | Freq: Two times a day (BID) | ORAL | 0 refills | Status: AC

## 2018-04-18 NOTE — MAU Note (Signed)
Pt presents to MAU with complaints of cough, sore throat, and a cough for three days. +FM, denies any VB or LOF, or any other pregnancy complaints

## 2018-04-18 NOTE — Progress Notes (Signed)
After client left, Flu test came back positive.  Prescribed medication to pharmacy and called client to notify her.  Nolene BernheimERRI BURLESON, RN, MSN, NP-BC Nurse Practitioner, [facility], [facility] 04/18/2018 1:20 PM

## 2018-04-18 NOTE — MAU Provider Note (Signed)
History     CSN: [id number]  Arrival date and time: 04/18/18 1109   First Provider Initiated Contact with Patient 04/18/18 1159      Chief Complaint  Patient presents with  . Sore Throat  . Cough   HPI Barbara Ramos 27 y.o. 619w2d  Has been sick for 3 days.  Has not used any medication at home.  Has felt the baby moving well.  Symptoms include nasal congestion, sore throat and cough.  Did not get a flu shot in this pregnancy.  [facility]  OB History    Gravida  3   Para  2   Term  2   Preterm      AB      Living  2     SAB      TAB      Ectopic      Multiple      Live Births  2           Past Medical History:  Diagnosis Date  . Allergy   . Anxiety   . Migraines     Past Surgical History:  Procedure Laterality Date  . FOOT SURGERY    . WISDOM TOOTH EXTRACTION      Family History  Problem Relation Age of Onset  . Healthy Mother     Social History   Tobacco Use  . Smoking status: Former Smoker    Packs/day: 0.01    Types: Cigarettes    Last attempt to quit: 09/23/2017    Years since quitting: 0.5  . Smokeless tobacco: Never Used  Substance Use Topics  . Alcohol use: No  . Drug use: No    Allergies:  Allergies  Allergen Reactions  . Ibuprofen     "triggers headaches"    Medications Prior to Admission  Medication Sig Dispense Refill Last Dose  . folic acid (FOLVITE) 1 MG tablet Take 1 mg by mouth daily.   Taking  . prenatal vitamin w/FE, FA (PRENATAL 1 + 1) 27-1 MG TABS tablet Take 1 tablet by mouth daily at 12 noon.   Taking  . terconazole (TERAZOL 3) 0.8 % vaginal cream Place 1 applicator vaginally at bedtime. 20 g 0     Review of Systems  Constitutional: Negative for fever.  Gastrointestinal: Negative for abdominal pain, constipation, diarrhea, nausea and vomiting.  Genitourinary: Negative for dysuria, vaginal bleeding and vaginal discharge.   Physical Exam   Blood pressure 117/65, pulse (!)  106, temperature 98.3 F (36.8 C), resp. rate 16, height 5\' 3"  (1.6 m), weight 72 kg, last menstrual period 10/16/2017.  Physical Exam  Nursing note and vitals reviewed. Constitutional: She is oriented to person, place, and time. She appears well-developed and well-nourished.  HENT:  Head: Normocephalic.  Nasal congestion noted  Eyes: EOM are normal.  Neck: Neck supple.  Cardiovascular: Normal rate, regular rhythm and normal heart sounds.  Respiratory: Effort normal and breath sounds normal. No respiratory distress. She has no wheezes.  Musculoskeletal: Normal range of motion.  Neurological: She is alert and oriented to person, place, and time.  Skin: Skin is warm and dry.  Psychiatric: She has a normal mood and affect.    MAU Course  Procedures Results for orders placed or performed during the hospital encounter of 04/18/18 (from the past 24 hour(s))  Influenza panel by PCR (type A & B)     Status: Abnormal   Collection Time: 04/18/18 12:43 PM  Result Value Ref Range   Influenza A By PCR NEGATIVE NEGATIVE   Influenza B By PCR POSITIVE (A) NEGATIVE  Group A Strep by PCR     Status: None   Collection Time: 04/18/18 12:43 PM  Result Value Ref Range   Group A Strep by PCR NOT DETECTED NOT DETECTED    MDM Flu and strep swabs done.  Results pending.  Will discharge home with symptomatic treatment as client does not appear to be ill.  Assessment and Plan  Upper respiratory infection Flu positive - influenza B - client had left hospital when results came - called her at home and prescribed Tamiflu to her pharmacy.  Plan Note for work given to be out for a few days so she can rest and take medication for her URI symptoms. Get your medications at the pharmacy. Use cough drops frequently.l Drink at least 8 8-oz glasses of water every day. Return if you are worsening with fever or wheezing.  Terri L Burleson 04/18/2018, 12:04 PM

## 2018-04-18 NOTE — Discharge Instructions (Signed)
Get your medications at the pharmacy. Use cough drops frequently.l Drink at least 8 8-oz glasses of water every day. Return if you are worsening with fever or wheezing.   Cough, Adult  Coughing is a reflex that clears your throat and your airways. Coughing helps to heal and protect your lungs. It is normal to cough occasionally, but a cough that happens with other symptoms or lasts a long time may be a sign of a condition that needs treatment. A cough may last only 2-3 weeks (acute), or it may last longer than 8 weeks (chronic). What are the causes? Coughing is commonly caused by:  Breathing in substances that irritate your lungs.  A viral or bacterial respiratory infection.  Allergies.  Asthma.  Postnasal drip.  Smoking.  Acid backing up from the stomach into the esophagus (gastroesophageal reflux).  Certain medicines.  Chronic lung problems, including COPD (or rarely, lung cancer).  Other medical conditions such as heart failure. Follow these instructions at home: Pay attention to any changes in your symptoms. Take these actions to help with your discomfort:  Take medicines only as told by your health care provider. ? If you were prescribed an antibiotic medicine, take it as told by your health care provider. Do not stop taking the antibiotic even if you start to feel better. ? Talk with your health care provider before you take a cough suppressant medicine.  Drink enough fluid to keep your urine clear or pale yellow.  If the air is dry, use a cold steam vaporizer or humidifier in your bedroom or your home to help loosen secretions.  Avoid anything that causes you to cough at work or at home.  If your cough is worse at night, try sleeping in a semi-upright position.  Avoid cigarette smoke. If you smoke, quit smoking. If you need help quitting, ask your health care provider.  Avoid caffeine.  Avoid alcohol.  Rest as needed. Contact a health care provider  if:  You have new symptoms.  You cough up pus.  Your cough does not get better after 2-3 weeks, or your cough gets worse.  You cannot control your cough with suppressant medicines and you are losing sleep.  You develop pain that is getting worse or pain that is not controlled with pain medicines.  You have a fever.  You have unexplained weight loss.  You have night sweats. Get help right away if:  You cough up blood.  You have difficulty breathing.  Your heartbeat is very fast. This information is not intended to replace advice given to you by your health care provider. Make sure you discuss any questions you have with your health care provider. Document Released: 10/07/2010 Document Revised: 09/16/2015 Document Reviewed: 06/17/2014 Elsevier Interactive Patient Education  2019 [employer]  Safe Medications in Pregnancy   Acne: Benzoyl Peroxide Salicylic Acid  Backache/Headache: Tylenol: 2 regular strength every 4 hours OR              2 Extra strength every 6 hours  Colds/Coughs/Allergies: Benadryl (alcohol free) 25 mg every 6 hours as needed Breath right strips Claritin Cepacol throat lozenges Chloraseptic throat spray Cold-Eeze- up to three times per day Cough drops, alcohol free Flonase (by prescription only) Guaifenesin Mucinex Robitussin DM (plain only, alcohol free) Saline nasal spray/drops Sudafed (pseudoephedrine) & Actifed ** use only after [redacted] weeks gestation and if you do not have high blood pressure Tylenol Vicks Vaporub Zinc lozenges Zyrtec   Constipation: Colace Ducolax suppositories Fleet  enema Glycerin suppositories Metamucil Milk of magnesia Miralax Senokot Smooth move tea  Diarrhea: Kaopectate Imodium A-D  *NO pepto Bismol  Hemorrhoids: Anusol Anusol HC Preparation H Tucks  Indigestion: Tums Maalox Mylanta Zantac  Pepcid  Insomnia: Benadryl (alcohol free) 25mg  every 6 hours as needed Tylenol PM Unisom, no  Gelcaps  Leg Cramps: Tums MagGel  Nausea/Vomiting:  Bonine Dramamine Emetrol Ginger extract Sea bands Meclizine  Nausea medication to take during pregnancy:  Unisom (doxylamine succinate 25 mg tablets) Take one tablet daily at bedtime. If symptoms are not adequately controlled, the dose can be increased to a maximum recommended dose of two tablets daily (1/2 tablet in the morning, 1/2 tablet mid-afternoon and one at bedtime). Vitamin B6 100mg  tablets. Take one tablet twice a day (up to 200 mg per day).  Skin Rashes: Aveeno products Benadryl cream or 25mg  every 6 hours as needed Calamine Lotion 1% cortisone cream  Yeast infection: Gyne-lotrimin 7 Monistat 7   **If taking multiple medications, please check labels to avoid duplicating the same active ingredients **take medication as directed on the label ** Do not exceed 4000 mg of tylenol in 24 hours **Do not take medications that contain aspirin or ibuprofen

## 2018-04-24 NOTE — L&D Delivery Note (Addendum)
Patient is a 28 y.o. now G5P2 s/p IUFD delivery at [redacted]w[redacted]d, who was admitted for IUFD 2/2 abruption.  She progressed with augmentation (FB and pitocin) to complete and pushed 10 minutes to deliver.  Cord clamped by CNM and cut by FOB.  Placenta intact and spontaneous directly after baby with large clots following that confirmed abruption, bleeding large. Cytotec 800, TXA and platelets given following delivery, with Cryo pending.    Delivery Note At 2:00 AM a non-viable child was delivered via Vaginal, Spontaneous  Placenta intact and spontaneous directly after baby with large clots following that confirmed abruption, bleeding large. 3V Cord:  with the following complications: IUFD and abruption.   Anesthesia: Epidural  Episiotomy: None Lacerations:  None Suture Repair: None  Est. Blood Loss (mL):  ~ 400  Mom to postpartum.  Baby to [place].  Sharyon Cable CNM 06/18/2018, 3:59 AM

## 2018-04-25 ENCOUNTER — Ambulatory Visit (INDEPENDENT_AMBULATORY_CARE_PROVIDER_SITE_OTHER): Payer: PRIVATE HEALTH INSURANCE | Admitting: Medical

## 2018-04-25 ENCOUNTER — Encounter: Payer: Self-pay | Admitting: Medical

## 2018-04-25 ENCOUNTER — Other Ambulatory Visit: Payer: PRIVATE HEALTH INSURANCE

## 2018-04-25 DIAGNOSIS — Z3480 Encounter for supervision of other normal pregnancy, unspecified trimester: Secondary | ICD-10-CM

## 2018-04-25 DIAGNOSIS — Z3482 Encounter for supervision of other normal pregnancy, second trimester: Secondary | ICD-10-CM

## 2018-04-25 NOTE — Progress Notes (Signed)
Pt presents for ROB and 2 gtt labs.  Tdap reading material given; pt will decide later.

## 2018-04-25 NOTE — Progress Notes (Signed)
   PRENATAL VISIT NOTE  Subjective:  Barbara Ramos is a 28 y.o. G3P2002 at [redacted]w[redacted]d being seen today for ongoing prenatal care.  She is currently monitored for the following issues for this low-risk pregnancy and has Positive urine pregnancy test; Supervision of other normal pregnancy, antepartum; Chlamydia infection during pregnancy; and Late prenatal care affecting pregnancy, antepartum, second trimester on their problem list.  Patient reports no complaints.  Contractions: Not present. Vag. Bleeding: None.  Movement: Present. Denies leaking of fluid.   The following portions of the patient's history were reviewed and updated as appropriate: allergies, current medications, past family history, past medical history, past social history, past surgical history and problem list. Problem list updated.  Objective:   Vitals:   04/25/18 0916  BP: 102/78  Pulse: 87  Weight: 158 lb 3.2 oz (71.8 kg)    Fetal Status: Fetal Heart Rate (bpm): 145 Fundal Height: 27 cm Movement: Present     General:  Alert, oriented and cooperative. Patient is in no acute distress.  Skin: Skin is warm and dry. No rash noted.   Cardiovascular: Normal heart rate noted  Respiratory: Normal respiratory effort, no problems with respiration noted  Abdomen: Soft, gravid, appropriate for gestational age.  Pain/Pressure: Present     Pelvic: Cervical exam deferred        Extremities: Normal range of motion.  Edema: None  Mental Status: Normal mood and affect. Normal behavior. Normal judgment and thought content.   Assessment and Plan:  Pregnancy: G3P2002 at [redacted]w[redacted]d  1. Supervision of other normal pregnancy, antepartum - Glucose Tolerance, 2 Hours w/1 Hour - CBC - RPR - HIV Antibody (routine testing w rflx) - TDap info given, will decide for next visit   Preterm labor symptoms and general obstetric precautions including but not limited to vaginal bleeding, contractions, leaking of fluid and fetal movement were reviewed in  detail with the patient. Please refer to After Visit Summary for other counseling recommendations.  Return in about 2 weeks (around 05/09/2018) for LOB.  Future Appointments  Date Time Provider Department Center  05/09/2018  3:45 PM Sharyon Cable, CNM [facility] None    Vonzella Nipple, PA-C

## 2018-04-25 NOTE — Patient Instructions (Signed)

## 2018-04-26 LAB — CBC
HEMOGLOBIN: 11.1 g/dL (ref 11.1–15.9)
Hematocrit: 33.9 % — ABNORMAL LOW (ref 34.0–46.6)
MCH: 28.4 pg (ref 26.6–33.0)
MCHC: 32.7 g/dL (ref 31.5–35.7)
MCV: 87 fL (ref 79–97)
PLATELETS: 296 10*3/uL (ref 150–450)
RBC: 3.91 x10E6/uL (ref 3.77–5.28)
RDW: 13.1 % (ref 12.3–15.4)
WBC: 8.1 10*3/uL (ref 3.4–10.8)

## 2018-04-26 LAB — RPR: RPR: NONREACTIVE

## 2018-04-26 LAB — HIV ANTIBODY (ROUTINE TESTING W REFLEX): HIV Screen 4th Generation wRfx: NONREACTIVE

## 2018-04-26 LAB — GLUCOSE TOLERANCE, 2 HOURS W/ 1HR
GLUCOSE, 1 HOUR: 105 mg/dL (ref 65–179)
GLUCOSE, 2 HOUR: 146 mg/dL (ref 65–152)
GLUCOSE, FASTING: 73 mg/dL (ref 65–91)

## 2018-05-09 ENCOUNTER — Encounter: Payer: Self-pay | Admitting: Certified Nurse Midwife

## 2018-05-09 ENCOUNTER — Ambulatory Visit (INDEPENDENT_AMBULATORY_CARE_PROVIDER_SITE_OTHER): Payer: PRIVATE HEALTH INSURANCE | Admitting: Certified Nurse Midwife

## 2018-05-09 VITALS — BP 111/68 | HR 80 | Wt 161.0 lb

## 2018-05-09 DIAGNOSIS — Z3A29 29 weeks gestation of pregnancy: Secondary | ICD-10-CM

## 2018-05-09 DIAGNOSIS — Z3480 Encounter for supervision of other normal pregnancy, unspecified trimester: Secondary | ICD-10-CM

## 2018-05-09 DIAGNOSIS — Z3483 Encounter for supervision of other normal pregnancy, third trimester: Secondary | ICD-10-CM

## 2018-05-09 DIAGNOSIS — D573 Sickle-cell trait: Secondary | ICD-10-CM

## 2018-05-09 DIAGNOSIS — O99019 Anemia complicating pregnancy, unspecified trimester: Secondary | ICD-10-CM

## 2018-05-09 DIAGNOSIS — O99013 Anemia complicating pregnancy, third trimester: Secondary | ICD-10-CM

## 2018-05-09 NOTE — Patient Instructions (Signed)

## 2018-05-09 NOTE — Progress Notes (Signed)
   PRENATAL VISIT NOTE  Subjective:  Barbara Ramos is a 28 y.o. G3P2002 at [redacted]w[redacted]d being seen today for ongoing prenatal care.  She is currently monitored for the following issues for this low-risk pregnancy and has Positive urine pregnancy test; Supervision of other normal pregnancy, antepartum; Chlamydia infection during pregnancy; and Late prenatal care affecting pregnancy, antepartum, second trimester on their problem list.  Patient reports no complaints.  Contractions: Irritability. Vag. Bleeding: None.  Movement: Present. Denies leaking of fluid.   The following portions of the patient's history were reviewed and updated as appropriate: allergies, current medications, past family history, past medical history, past social history, past surgical history and problem list. Problem list updated.  Objective:   Vitals:   05/09/18 1603  BP: 111/68  Pulse: 80  Weight: 161 lb (73 kg)    Fetal Status: Fetal Heart Rate (bpm): 136 Fundal Height: 28 cm Movement: Present     General:  Alert, oriented and cooperative. Patient is in no acute distress.  Skin: Skin is warm and dry. No rash noted.   Cardiovascular: Normal heart rate noted  Respiratory: Normal respiratory effort, no problems with respiration noted  Abdomen: Soft, gravid, appropriate for gestational age.  Pain/Pressure: Present     Pelvic: Cervical exam deferred        Extremities: Normal range of motion.  Edema: Trace  Mental Status: Normal mood and affect. Normal behavior. Normal judgment and thought content.   Assessment and Plan:  Pregnancy: G3P2002 at [redacted]w[redacted]d  1. Supervision of other normal pregnancy, antepartum - patient doing well, no complaints  - anticipatory guidance on upcoming appointments   2. 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 Please refer to After Visit Summary for other counseling recommendations.  Return in about 2 weeks (around 05/23/2018) for ROB.  Future Appointments  Date Time Provider Department Center  05/23/2018  2:00 PM Sharyon Cable, CNM [facility] None    Sharyon Cable, [place]

## 2018-05-11 LAB — CULTURE, OB URINE

## 2018-05-11 LAB — URINE CULTURE, OB REFLEX: Organism ID, Bacteria: NO GROWTH

## 2018-05-20 ENCOUNTER — Other Ambulatory Visit: Payer: Self-pay

## 2018-05-20 ENCOUNTER — Encounter (HOSPITAL_COMMUNITY): Payer: Self-pay

## 2018-05-20 ENCOUNTER — Inpatient Hospital Stay (HOSPITAL_COMMUNITY)
Admission: AD | Admit: 2018-05-20 | Discharge: 2018-05-20 | Disposition: A | Discharge status: Home / Self Care | Payer: PRIVATE HEALTH INSURANCE | Attending: Obstetrics and Gynecology | Admitting: Obstetrics and Gynecology

## 2018-05-20 DIAGNOSIS — Z87891 Personal history of nicotine dependence: Secondary | ICD-10-CM | POA: Diagnosis not present

## 2018-05-20 DIAGNOSIS — Z3A30 30 weeks gestation of pregnancy: Secondary | ICD-10-CM | POA: Diagnosis not present

## 2018-05-20 DIAGNOSIS — Z0371 Encounter for suspected problem with amniotic cavity and membrane ruled out: Secondary | ICD-10-CM | POA: Diagnosis not present

## 2018-05-20 DIAGNOSIS — R109 Unspecified abdominal pain: Secondary | ICD-10-CM | POA: Diagnosis present

## 2018-05-20 DIAGNOSIS — O4703 False labor before 37 completed weeks of gestation, third trimester: Secondary | ICD-10-CM

## 2018-05-20 LAB — AMNISURE RUPTURE OF MEMBRANE (ROM) NOT AT ARMC: Amnisure ROM: NEGATIVE

## 2018-05-20 LAB — URINALYSIS, ROUTINE W REFLEX MICROSCOPIC
Bilirubin Urine: NEGATIVE
Glucose, UA: NEGATIVE mg/dL
Hgb urine dipstick: NEGATIVE
Ketones, ur: NEGATIVE mg/dL
Leukocytes, UA: NEGATIVE
Nitrite: NEGATIVE
Protein, ur: NEGATIVE mg/dL
Specific Gravity, Urine: 1.003 — ABNORMAL LOW (ref 1.005–1.030)
pH: 6 (ref 5.0–8.0)

## 2018-05-20 LAB — WET PREP, GENITAL
Clue Cells Wet Prep HPF POC: NONE SEEN
Sperm: NONE SEEN
Trich, Wet Prep: NONE SEEN
Yeast Wet Prep HPF POC: NONE SEEN

## 2018-05-20 LAB — POCT FERN TEST: POCT FERN TEST: NEGATIVE

## 2018-05-20 NOTE — MAU Provider Note (Signed)
Chief Complaint:  Contractions and Rupture of Membranes   First Provider Initiated Contact with Patient 05/20/18 1350     HPI: Barbara Ramos is a 28 y.o. G3P2002 at [id number] who presents to maternity admissions reporting abdominal pain & LOF. Symptoms began a week ago. States she routinely feels like her underwear is wet. States her ob told her that it is urine. States the discharge is clear and without odor.  Also reports 5 contractions per hour, every hour, for the last week.  Denies vaginal bleeding. No n/v/d. Had intercourse a few days ago. Normal fetal movement.   Location: abdomen Quality: tightening,sharp Severity: 3/10 in pain scale Duration: 1 week Timing: 5x per hour Modifying factors: none Associated signs and symptoms: vaginal discharge  Past Medical History:  Diagnosis Date  . Allergy   . Anxiety   . Migraines    OB History  Gravida Para Term Preterm AB Living  3 2 2     2   SAB TAB Ectopic Multiple Live Births          2    # Outcome Date GA Lbr Len/2nd Weight Sex Delivery Anes PTL Lv  3 Current           2 Term 03/28/14    M Vag-Spont   LIV  1 Term 12/27/10    F Vag-Spont   LIV   Past Surgical History:  Procedure Laterality Date  . FOOT SURGERY    . WISDOM TOOTH EXTRACTION     Family History  Problem Relation Age of Onset  . Healthy Mother    Social History   Tobacco Use  . Smoking status: Former Smoker    Packs/day: 0.01    Types: Cigarettes    Last attempt to quit: 09/23/2017    Years since quitting: 0.6  . Smokeless tobacco: Never Used  Substance Use Topics  . Alcohol use: No  . Drug use: No   Allergies  Allergen Reactions  . Ibuprofen     "triggers headaches"   No medications prior to admission.    I have reviewed patient's Past Medical Hx, Surgical Hx, Family Hx, Social Hx, medications and allergies.   ROS:  Review of Systems  Constitutional: Negative.   Gastrointestinal: Positive for abdominal pain. Negative for constipation, diarrhea,  nausea and vomiting.  Genitourinary: Positive for vaginal discharge. Negative for dysuria and vaginal bleeding.    Physical Exam   Patient Vitals for the past 24 hrs:  BP Temp Temp src Pulse Resp SpO2 Height Weight  05/20/18 1459 (!) 106/56 - - 70 16 - - -  05/20/18 1331 (!) 113/58 98.2 F (36.8 C) Oral 83 19 100 % - -  05/20/18 1327 - - - - - - 5\' 4"  (1.626 m) 74 kg    Constitutional: Well-developed, well-nourished female in no acute distress.  Cardiovascular: normal rate & rhythm, no murmur Respiratory: normal effort, lung sounds clear throughout GI: Abd soft, non-tender, gravid appropriate for gestational age. Pos BS x 4 MS: Extremities nontender, no edema, normal ROM Neurologic: Alert and oriented x 4.  GU:    Moderate amount of thin white discharge. No pooling of fluid. No blood. Cervix closed/thick.   NST:  Baseline: 130 bpm, Variability: Good {> 6 bpm), Accelerations: Reactive and Decelerations: Absent   Labs: Results for orders placed or performed during the hospital encounter of 05/20/18 (from the past 24 hour(s))  Urinalysis, Routine w reflex microscopic     Status: Abnormal   Collection Time:  05/20/18  1:24 PM  Result Value Ref Range   Color, Urine STRAW (A) YELLOW   APPearance CLEAR CLEAR   Specific Gravity, Urine 1.003 (L) 1.005 - 1.030   pH 6.0 5.0 - 8.0   Glucose, UA NEGATIVE NEGATIVE mg/dL   Hgb urine dipstick NEGATIVE NEGATIVE   Bilirubin Urine NEGATIVE NEGATIVE   Ketones, ur NEGATIVE NEGATIVE mg/dL   Protein, ur NEGATIVE NEGATIVE mg/dL   Nitrite NEGATIVE NEGATIVE   Leukocytes, UA NEGATIVE NEGATIVE  Wet prep, genital     Status: Abnormal   Collection Time: 05/20/18  2:25 PM  Result Value Ref Range   Yeast Wet Prep HPF POC NONE SEEN NONE SEEN   Trich, Wet Prep NONE SEEN NONE SEEN   Clue Cells Wet Prep HPF POC NONE SEEN NONE SEEN   WBC, Wet Prep HPF POC FEW (A) NONE SEEN   Sperm NONE SEEN   Amnisure rupture of membrane (rom)not at [facility]     Status: None    Collection Time: 05/20/18  2:25 PM  Result Value Ref Range   Amnisure ROM NEGATIVE   Fern Test     Status: None   Collection Time: 05/20/18  2:45 PM  Result Value Ref Range   POCT Fern Test Negative = intact amniotic membranes     Imaging:  No results found.  MAU Course: Orders Placed This Encounter  Procedures  . Wet prep, genital  . Urinalysis, Routine w reflex microscopic  . Amnisure rupture of membrane (rom)not at [facility]  . Fern Test  . Discharge patient   No orders of the defined types were placed in this encounter.   MDM: No pooling, fern & amnisure negative Cervix closed/thick Ctx x 1 on monitor F/u with OB  Assessment: 1. Encounter for suspected PROM, with rupture of membranes not found   2. [redacted] weeks gestation of pregnancy   3. Preterm uterine contractions in third trimester, antepartum     Plan: Discharge home in stable condition.  Preterm Labor precautions and fetal kick counts   Allergies as of 05/20/2018      Reactions   Ibuprofen    "triggers headaches"      Medication List    STOP taking these medications   benzonatate 100 MG capsule Commonly known as:  TESSALON   fluticasone 50 MCG/ACT nasal spray Commonly known as:  FLONASE     TAKE these medications   prenatal vitamin w/FE, FA 27-1 MG Tabs tablet Take 1 tablet by mouth daily at 12 noon.       Judeth Horn, NP 05/20/2018 6:56 PM

## 2018-05-20 NOTE — MAU Note (Signed)
Presents with c/o LOF that began 1 week ago.  Also reports having ctxs that began approx 1 week ago.  States 5 cyxs per hour.  Denies VB. Reports FM.

## 2018-05-20 NOTE — Discharge Instructions (Signed)
Preterm Labor and Birth Information  Pregnancy normally lasts 39-41 weeks. Preterm labor is when labor starts early. It starts before you have been pregnant for 37 whole weeks.  What are the risk factors for preterm labor?  Preterm labor is more likely to occur in women who:  · Have an infection while pregnant.  · Have a cervix that is short.  · Have gone into preterm labor before.  · Have had surgery on their cervix.  · Are younger than age 26.  · Are older than age 35.  · Are African American.  · Are pregnant with two or more babies.  · Take street drugs while pregnant.  · Smoke while pregnant.  · Do not gain enough weight while pregnant.  · Got pregnant right after another pregnancy.  What are the symptoms of preterm labor?  Symptoms of preterm labor include:  · Cramps. The cramps may feel like the cramps some women get during their period. The cramps may happen with watery poop (diarrhea).  · Pain in the belly (abdomen).  · Pain in the lower back.  · Regular contractions or tightening. It may feel like your belly is getting tighter.  · Pressure in the lower belly that seems to get stronger.  · More fluid (discharge) leaking from the vagina. The fluid may be watery or bloody.  · Water breaking.  Why is it important to notice signs of preterm labor?  Babies who are born early may not be fully developed. They have a higher chance for:  · Long-term heart problems.  · Long-term lung problems.  · Trouble controlling body systems, like breathing.  · Bleeding in the brain.  · A condition called cerebral palsy.  · Learning difficulties.  · Death.  These risks are highest for babies who are born before 34 weeks of pregnancy.  How is preterm labor treated?  Treatment depends on:  · How long you were pregnant.  · Your condition.  · The health of your baby.  Treatment may involve:  · Having a stitch (suture) placed in your cervix. When you give birth, your cervix opens so the baby can come out. The stitch keeps the cervix  from opening too soon.  · Staying at the hospital.  · Taking or getting medicines, such as:  ? Hormone medicines.  ? Medicines to stop contractions.  ? Medicines to help the baby’s lungs develop.  ? Medicines to prevent your baby from having cerebral palsy.  What should I do if I am in preterm labor?  If you think you are going into labor too soon, call your doctor right away.  How can I prevent preterm labor?  · Do not use any tobacco products.  ? Examples of these are cigarettes, chewing tobacco, and e-cigarettes.  ? If you need help quitting, ask your doctor.  · Do not use street drugs.  · Do not use any medicines unless you ask your doctor if they are safe for you.  · Talk with your doctor before taking any herbal supplements.  · Make sure you gain enough weight.  · Watch for infection. If you think you might have an infection, get it checked right away.  · If you have gone into preterm labor before, tell your doctor.  This information is not intended to replace advice given to you by your health care provider. Make sure you discuss any questions you have with your health care provider.  Document Released: 07/07/2008 Document Revised:   09/21/2015 Document Reviewed: 09/01/2015  Elsevier Interactive Patient Education © 2019 [employer]  Fetal Movement Counts  Patient Name: ________________________________________________ Patient Due Date: ____________________  What is a fetal movement count?    A fetal movement count is the number of times that you feel your baby move during a certain amount of time. This may also be called a fetal kick count. A fetal movement count is recommended for every pregnant woman. You may be asked to start counting fetal movements as early as week 28 of your pregnancy.  Pay attention to when your baby is most active. You may notice your baby's sleep and wake cycles. You may also notice things that make your baby move more. You should do a fetal movement count:  · When your baby is  normally most active.  · At the same time each day.  A good time to count movements is while you are resting, after having something to eat and drink.  How do I count fetal movements?  1. Find a quiet, comfortable area. Sit, or lie down on your side.  2. Write down the date, the start time and stop time, and the number of movements that you felt between those two times. Take this information with you to your health care visits.  3. For 2 hours, count kicks, flutters, swishes, rolls, and jabs. You should feel at least 10 movements during 2 hours.  4. You may stop counting after you have felt 10 movements.  5. If you do not feel 10 movements in 2 hours, have something to eat and drink. Then, keep resting and counting for 1 hour. If you feel at least 4 movements during that hour, you may stop counting.  Contact a health care provider if:  · You feel fewer than 4 movements in 2 hours.  · Your baby is not moving like he or she usually does.  Date: ____________ Start time: ____________ Stop time: ____________ Movements: ____________  Date: ____________ Start time: ____________ Stop time: ____________ Movements: ____________  Date: ____________ Start time: ____________ Stop time: ____________ Movements: ____________  Date: ____________ Start time: ____________ Stop time: ____________ Movements: ____________  Date: ____________ Start time: ____________ Stop time: ____________ Movements: ____________  Date: ____________ Start time: ____________ Stop time: ____________ Movements: ____________  Date: ____________ Start time: ____________ Stop time: ____________ Movements: ____________  Date: ____________ Start time: ____________ Stop time: ____________ Movements: ____________  Date: ____________ Start time: ____________ Stop time: ____________ Movements: ____________  This information is not intended to replace advice given to you by your health care provider. Make sure you discuss any questions you have with your health care  provider.  Document Released: 05/10/2006 Document Revised: 12/08/2015 Document Reviewed: 05/20/2015  Elsevier Interactive Patient Education © 2019 [employer]

## 2018-05-21 LAB — GC/CHLAMYDIA PROBE AMP (~~LOC~~) NOT AT ARMC
Chlamydia: NEGATIVE
Neisseria Gonorrhea: NEGATIVE

## 2018-05-23 ENCOUNTER — Other Ambulatory Visit: Payer: Self-pay

## 2018-05-23 ENCOUNTER — Ambulatory Visit (INDEPENDENT_AMBULATORY_CARE_PROVIDER_SITE_OTHER): Payer: PRIVATE HEALTH INSURANCE | Admitting: Certified Nurse Midwife

## 2018-05-23 ENCOUNTER — Encounter: Payer: Self-pay | Admitting: Certified Nurse Midwife

## 2018-05-23 VITALS — BP 105/68 | HR 81 | Wt 162.9 lb

## 2018-05-23 DIAGNOSIS — O0933 Supervision of pregnancy with insufficient antenatal care, third trimester: Secondary | ICD-10-CM

## 2018-05-23 DIAGNOSIS — Z3480 Encounter for supervision of other normal pregnancy, unspecified trimester: Secondary | ICD-10-CM

## 2018-05-23 DIAGNOSIS — Z3483 Encounter for supervision of other normal pregnancy, third trimester: Secondary | ICD-10-CM

## 2018-05-23 DIAGNOSIS — O0932 Supervision of pregnancy with insufficient antenatal care, second trimester: Secondary | ICD-10-CM

## 2018-05-23 NOTE — Progress Notes (Signed)
Patient reports fetal movement with occasional pressure. 

## 2018-05-23 NOTE — Progress Notes (Signed)
   PRENATAL VISIT NOTE  Subjective:  Barbara Ramos is a 28 y.o. G3P2002 at [redacted]w[redacted]d being seen today for ongoing prenatal care.  She is currently monitored for the following issues for this low-risk pregnancy and has Positive urine pregnancy test; Supervision of other normal pregnancy, antepartum; Chlamydia infection during pregnancy; and Late prenatal care affecting pregnancy, antepartum, second trimester on their problem list.  Patient reports no complaints.  Contractions: Not present. Vag. Bleeding: None.  Movement: Present. Denies leaking of fluid.   The following portions of the patient's history were reviewed and updated as appropriate: allergies, current medications, past family history, past medical history, past social history, past surgical history and problem list. Problem list updated.  Objective:   Vitals:   05/23/18 1406  BP: 105/68  Pulse: 81  Weight: 162 lb 14.4 oz (73.9 kg)    Fetal Status: Fetal Heart Rate (bpm): 132 Fundal Height: 30 cm Movement: Present  Presentation: Vertex  General:  Alert, oriented and cooperative. Patient is in no acute distress.  Skin: Skin is warm and dry. No rash noted.   Cardiovascular: Normal heart rate noted  Respiratory: Normal respiratory effort, no problems with respiration noted  Abdomen: Soft, gravid, appropriate for gestational age.  Pain/Pressure: Present     Pelvic: Cervical exam deferred        Extremities: Normal range of motion.  Edema: None  Mental Status: Normal mood and affect. Normal behavior. Normal judgment and thought content.   Assessment and Plan:  Pregnancy: G3P2002 at [redacted]w[redacted]d  1. Supervision of other normal pregnancy, antepartum - Patient doing well, reports occasional pelvic pressure none currently  - Discussed pelvic pressure with new occurrence of cephalic presentation of fetus  - Anticipatory guidance on upcoming appointments  - Routine prenatal care   2. Late prenatal care affecting pregnancy, antepartum, second  trimester  Preterm labor symptoms and general obstetric precautions including but not limited to vaginal bleeding, contractions, leaking of fluid and fetal movement were reviewed in detail with the patient. Please refer to After Visit Summary for other counseling recommendations.  Return in about 2 weeks (around 06/06/2018) for ROB.  Future Appointments  Date Time Provider Department Center  06/06/2018  3:45 PM Sharyon Cable, CNM [facility] None    Sharyon Cable, CNM

## 2018-06-06 ENCOUNTER — Encounter: Payer: Self-pay | Admitting: Certified Nurse Midwife

## 2018-06-06 ENCOUNTER — Ambulatory Visit (INDEPENDENT_AMBULATORY_CARE_PROVIDER_SITE_OTHER): Payer: PRIVATE HEALTH INSURANCE | Admitting: Obstetrics

## 2018-06-06 VITALS — BP 112/66 | HR 99 | Wt 163.0 lb

## 2018-06-06 DIAGNOSIS — O99019 Anemia complicating pregnancy, unspecified trimester: Secondary | ICD-10-CM

## 2018-06-06 DIAGNOSIS — Z3483 Encounter for supervision of other normal pregnancy, third trimester: Secondary | ICD-10-CM

## 2018-06-06 DIAGNOSIS — Z3A33 33 weeks gestation of pregnancy: Secondary | ICD-10-CM

## 2018-06-06 DIAGNOSIS — Z3480 Encounter for supervision of other normal pregnancy, unspecified trimester: Secondary | ICD-10-CM

## 2018-06-06 DIAGNOSIS — O99013 Anemia complicating pregnancy, third trimester: Secondary | ICD-10-CM

## 2018-06-06 DIAGNOSIS — D573 Sickle-cell trait: Secondary | ICD-10-CM

## 2018-06-06 NOTE — Progress Notes (Signed)
Subjective:  Barbara Ramos is a 28 y.o. G3P2002 at 3519w2d being seen today for ongoing prenatal care.  She is currently monitored for the following issues for this low-risk pregnancy and has Positive urine pregnancy test; Supervision of other normal pregnancy, antepartum; Chlamydia infection during pregnancy; and Late prenatal care affecting pregnancy, antepartum, second trimester on their problem list.  Patient reports occasional contractions.  Contractions: Irregular. Vag. Bleeding: None.  Movement: Present. Denies leaking of fluid.   The following portions of the patient's history were reviewed and updated as appropriate: allergies, current medications, past family history, past medical history, past social history, past surgical history and problem list. Problem list updated.  Objective:   Vitals:   06/06/18 1600  BP: 112/66  Pulse: 99  Weight: 163 lb (73.9 kg)    Fetal Status: Fetal Heart Rate (bpm): 146   Movement: Present     General:  Alert, oriented and cooperative. Patient is in no acute distress.  Skin: Skin is warm and dry. No rash noted.   Cardiovascular: Normal heart rate noted  Respiratory: Normal respiratory effort, no problems with respiration noted  Abdomen: Soft, gravid, appropriate for gestational age. Pain/Pressure: Present     Pelvic:  Cervical exam deferred        Extremities: Normal range of motion.  Edema: Trace  Mental Status: Normal mood and affect. Normal behavior. Normal judgment and thought content.   Urinalysis:      Assessment and Plan:  Pregnancy: G3P2002 at 5719w2d  1. Supervision of other normal pregnancy, antepartum Rx: - POCT Urinalysis Dipstick  2. 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 Please refer to After Visit Summary for other  counseling recommendations.  Return in about 2 weeks (around 06/20/2018).   Brock BadHarper, Charles A, MD

## 2018-06-06 NOTE — Progress Notes (Signed)
Last UA : 05/20/18

## 2018-06-17 ENCOUNTER — Encounter (HOSPITAL_COMMUNITY): Payer: Self-pay

## 2018-06-17 ENCOUNTER — Other Ambulatory Visit: Payer: Self-pay

## 2018-06-17 ENCOUNTER — Inpatient Hospital Stay (HOSPITAL_COMMUNITY): Payer: PRIVATE HEALTH INSURANCE | Admitting: Anesthesiology

## 2018-06-17 ENCOUNTER — Inpatient Hospital Stay (HOSPITAL_COMMUNITY)
Admission: AD | Admit: 2018-06-17 | Discharge: 2018-06-19 | DRG: 805 | Disposition: A | Discharge status: Home / Self Care | Payer: PRIVATE HEALTH INSURANCE | Attending: Obstetrics and Gynecology | Admitting: Obstetrics and Gynecology

## 2018-06-17 ENCOUNTER — Telehealth: Payer: Self-pay

## 2018-06-17 ENCOUNTER — Inpatient Hospital Stay (HOSPITAL_COMMUNITY): Payer: PRIVATE HEALTH INSURANCE

## 2018-06-17 DIAGNOSIS — Z87891 Personal history of nicotine dependence: Secondary | ICD-10-CM | POA: Diagnosis not present

## 2018-06-17 DIAGNOSIS — R109 Unspecified abdominal pain: Secondary | ICD-10-CM

## 2018-06-17 DIAGNOSIS — O4593 Premature separation of placenta, unspecified, third trimester: Secondary | ICD-10-CM | POA: Diagnosis present

## 2018-06-17 DIAGNOSIS — O45023 Premature separation of placenta with disseminated intravascular coagulation, third trimester: Secondary | ICD-10-CM | POA: Diagnosis present

## 2018-06-17 DIAGNOSIS — O364XX0 Maternal care for intrauterine death, not applicable or unspecified: Principal | ICD-10-CM | POA: Diagnosis present

## 2018-06-17 DIAGNOSIS — O26893 Other specified pregnancy related conditions, third trimester: Secondary | ICD-10-CM | POA: Diagnosis not present

## 2018-06-17 DIAGNOSIS — Z3A34 34 weeks gestation of pregnancy: Secondary | ICD-10-CM

## 2018-06-17 DIAGNOSIS — Z3A35 35 weeks gestation of pregnancy: Secondary | ICD-10-CM | POA: Diagnosis not present

## 2018-06-17 DIAGNOSIS — R102 Pelvic and perineal pain: Secondary | ICD-10-CM

## 2018-06-17 HISTORY — DX: Sickle-cell trait: D57.3

## 2018-06-17 LAB — CBC
HCT: 29.5 % — ABNORMAL LOW (ref 36.0–46.0)
Hemoglobin: 9.6 g/dL — ABNORMAL LOW (ref 12.0–15.0)
MCH: 28.4 pg (ref 26.0–34.0)
MCHC: 32.5 g/dL (ref 30.0–36.0)
MCV: 87.3 fL (ref 80.0–100.0)
PLATELETS: 114 10*3/uL — AB (ref 150–400)
RBC: 3.38 MIL/uL — ABNORMAL LOW (ref 3.87–5.11)
RDW: 13.3 % (ref 11.5–15.5)
WBC: 14.3 10*3/uL — ABNORMAL HIGH (ref 4.0–10.5)
nRBC: 0 % (ref 0.0–0.2)

## 2018-06-17 LAB — COMPREHENSIVE METABOLIC PANEL
ALT: 9 U/L (ref 0–44)
AST: 17 U/L (ref 15–41)
Albumin: 3.1 g/dL — ABNORMAL LOW (ref 3.5–5.0)
Alkaline Phosphatase: 155 U/L — ABNORMAL HIGH (ref 38–126)
Anion gap: 9 (ref 5–15)
BUN: 5 mg/dL — ABNORMAL LOW (ref 6–20)
CO2: 20 mmol/L — ABNORMAL LOW (ref 22–32)
Calcium: 8.6 mg/dL — ABNORMAL LOW (ref 8.9–10.3)
Chloride: 105 mmol/L (ref 98–111)
Creatinine, Ser: 0.69 mg/dL (ref 0.44–1.00)
GFR calc Af Amer: >60 mL/min (ref >60)
GFR calc non Af Amer: >60 mL/min (ref >60)
Glucose, Bld: 85 mg/dL (ref 70–99)
Potassium: 3.7 mmol/L (ref 3.5–5.1)
Sodium: 134 mmol/L — ABNORMAL LOW (ref 135–145)
TOTAL PROTEIN: 5.9 g/dL — AB (ref 6.5–8.1)
Total Bilirubin: 0.5 mg/dL (ref 0.3–1.2)

## 2018-06-17 LAB — DIC (DISSEMINATED INTRAVASCULAR COAGULATION)PANEL
D-Dimer, Quant: >20 ug/mL-FEU — ABNORMAL HIGH (ref 0.00–0.50)
Fibrinogen: 207 mg/dL — ABNORMAL LOW (ref 210–475)
INR: 1.1
Platelets: 115 10*3/uL — ABNORMAL LOW (ref 150–400)
Prothrombin Time: 14.4 seconds (ref 11.4–15.2)
Smear Review: NONE SEEN
aPTT: 29 seconds (ref 24–36)

## 2018-06-17 LAB — PLATELET COUNT: Platelets: 94 10*3/uL — ABNORMAL LOW (ref 150–400)

## 2018-06-17 LAB — PREPARE RBC (CROSSMATCH)

## 2018-06-17 MED ORDER — LIDOCAINE HCL (PF) 1 % IJ SOLN
INTRAMUSCULAR | Status: DC | PRN
  Administered 2018-06-17: 11 mL via EPIDURAL

## 2018-06-17 MED ORDER — LACTATED RINGERS IV SOLN
INTRAVENOUS | Status: DC
  Administered 2018-06-17 – 2018-06-18 (×2): via INTRAVENOUS

## 2018-06-17 MED ORDER — LIDOCAINE HCL (PF) 1 % IJ SOLN: 30.0000 mL | INTRAMUSCULAR | Status: DC | PRN

## 2018-06-17 MED ORDER — FENTANYL CITRATE (PF) 100 MCG/2ML IJ SOLN: 100.0000 ug | INTRAMUSCULAR | Status: DC | PRN

## 2018-06-17 MED ORDER — OXYTOCIN 40 UNITS IN NORMAL SALINE INFUSION - SIMPLE MED
1.0000 m[IU]/min | INTRAVENOUS | Status: DC
  Administered 2018-06-17: 1 m[IU]/min via INTRAVENOUS
  Filled 2018-06-17: qty 1000

## 2018-06-17 MED ORDER — OXYCODONE-ACETAMINOPHEN 5-325 MG PO TABS: 2.0000 | ORAL_TABLET | ORAL | Status: DC | PRN

## 2018-06-17 MED ORDER — PHENYLEPHRINE 40 MCG/ML (10ML) SYRINGE FOR IV PUSH (FOR BLOOD PRESSURE SUPPORT)
80.0000 ug | PREFILLED_SYRINGE | INTRAVENOUS | Status: DC | PRN
  Filled 2018-06-17: qty 10

## 2018-06-17 MED ORDER — SODIUM CHLORIDE 0.9% IV SOLUTION: Freq: Once | INTRAVENOUS | Status: DC

## 2018-06-17 MED ORDER — LACTATED RINGERS IV SOLN
500.0000 mL | Freq: Once | INTRAVENOUS | Status: AC
  Administered 2018-06-17: 500 mL via INTRAVENOUS

## 2018-06-17 MED ORDER — OXYTOCIN 40 UNITS IN NORMAL SALINE INFUSION - SIMPLE MED: 2.5000 [IU]/h | INTRAVENOUS | Status: DC

## 2018-06-17 MED ORDER — LACTATED RINGERS IV SOLN
500.0000 mL | INTRAVENOUS | Status: DC | PRN
  Administered 2018-06-18: 500 mL via INTRAVENOUS

## 2018-06-17 MED ORDER — EPHEDRINE 5 MG/ML INJ
10.0000 mg | INTRAVENOUS | Status: DC | PRN
  Filled 2018-06-17: qty 2

## 2018-06-17 MED ORDER — SODIUM CHLORIDE (PF) 0.9 % IJ SOLN
INTRAMUSCULAR | Status: DC | PRN
  Administered 2018-06-17: 12 mL/h via EPIDURAL

## 2018-06-17 MED ORDER — PHENYLEPHRINE 40 MCG/ML (10ML) SYRINGE FOR IV PUSH (FOR BLOOD PRESSURE SUPPORT)
80.0000 ug | PREFILLED_SYRINGE | INTRAVENOUS | Status: DC | PRN
  Filled 2018-06-17 (×2): qty 10

## 2018-06-17 MED ORDER — OXYCODONE-ACETAMINOPHEN 5-325 MG PO TABS: 1.0000 | ORAL_TABLET | ORAL | Status: DC | PRN

## 2018-06-17 MED ORDER — ACETAMINOPHEN 325 MG PO TABS: 650.0000 mg | ORAL_TABLET | ORAL | Status: DC | PRN

## 2018-06-17 MED ORDER — DIPHENHYDRAMINE HCL 50 MG/ML IJ SOLN: 12.5000 mg | INTRAMUSCULAR | Status: DC | PRN

## 2018-06-17 MED ORDER — OXYTOCIN BOLUS FROM INFUSION
500.0000 mL | Freq: Once | INTRAVENOUS | Status: AC
  Administered 2018-06-18: 500 mL via INTRAVENOUS

## 2018-06-17 MED ORDER — ONDANSETRON HCL 4 MG/2ML IJ SOLN
4.0000 mg | Freq: Four times a day (QID) | INTRAMUSCULAR | Status: DC | PRN
  Administered 2018-06-17 (×2): 4 mg via INTRAVENOUS
  Filled 2018-06-17 (×2): qty 2

## 2018-06-17 MED ORDER — SOD CITRATE-CITRIC ACID 500-334 MG/5ML PO SOLN: 30.0000 mL | ORAL | Status: DC | PRN

## 2018-06-17 MED ORDER — FENTANYL-BUPIVACAINE-NACL 0.5-0.125-0.9 MG/250ML-% EP SOLN
12.0000 mL/h | EPIDURAL | Status: DC | PRN
  Filled 2018-06-17: qty 250

## 2018-06-17 MED ORDER — FENTANYL CITRATE (PF) 100 MCG/2ML IJ SOLN
100.0000 ug | Freq: Once | INTRAMUSCULAR | Status: AC
  Administered 2018-06-17: 100 ug via INTRAVENOUS
  Filled 2018-06-17: qty 2

## 2018-06-17 NOTE — Progress Notes (Signed)
LABOR PROGRESS NOTE  Barbara Ramos is a 28 y.o. [phone] at [redacted]w[redacted]d  admitted for IOL for IUFD 2/2 placental abruption   Subjective: Patient is doing okay, is tearful at moments but is continuing to process situation- is comforted now that someone she is familiar with from office is here. Patient comfortable with epidural and support system is at bedside   Objective: BP (!) 93/51   Pulse 98   Temp 97.6 F (36.4 C) (Axillary)   Resp (!) 166   Ht 5\' 4"  (1.626 m)   Wt 73.9 kg   LMP 10/16/2017 (Exact Date)   BMI 27.97 kg/m  or  Vitals:   06/17/18 1901 06/17/18 1931 06/17/18 2001 06/17/18 2032  BP: (!) 120/50 116/64 111/65 (!) 93/51  Pulse: 86 89 66 98  Resp: 17 16 16  (!) 166  Temp:  97.6 F (36.4 C)    TempSrc:  Axillary    Weight:      Height:        Pitocin augmentation ordered to start at 1 milli-unit/min and increase by 2  Dilation: 4 Effacement (%): 80 Cervical Position: Middle Station: -2 Presentation: Vertex Exam by:: Suezanne Jacquet, RN  Labs: Lab Results  Component Value Date   WBC 14.3 (H) 06/17/2018   HGB 9.6 (L) 06/17/2018   HCT 29.5 (L) 06/17/2018   MCV 87.3 06/17/2018   PLT 115 (L) 06/17/2018   PLT 114 (L) 06/17/2018    Patient Active Problem List   Diagnosis Date Noted  . Abruptio placenta, third trimester 06/17/2018  . Supervision of other normal pregnancy, antepartum 02/21/2018  . Chlamydia infection during pregnancy 02/21/2018  . Late prenatal care affecting pregnancy, antepartum, second trimester 02/21/2018  . Positive urine pregnancy test 11/28/2017    Assessment / Plan: 28 y.o. [id number] at [redacted]w[redacted]d here for IOL for IUD  Labor: Pitocin augmentation initiated  Pain Control:  Epidural  Anticipated MOD:  Vaginal delivery   Sharyon Cable, CNM 06/17/2018, 8:47 PM

## 2018-06-17 NOTE — Anesthesia Preprocedure Evaluation (Signed)
Anesthesia Evaluation  Patient identified by MRN, date of birth, ID band Patient awake    Reviewed: Allergy & Precautions, NPO status , Patient's Chart, lab work & pertinent test results  Airway Mallampati: II  TM Distance: >3 FB Neck ROM: Full    Dental no notable dental hx.    Pulmonary neg pulmonary ROS, former smoker,    Pulmonary exam normal breath sounds clear to auscultation       Cardiovascular negative cardio ROS Normal cardiovascular exam Rhythm:Regular Rate:Normal     Neuro/Psych negative neurological ROS  negative psych ROS   GI/Hepatic negative GI ROS, Neg liver ROS,   Endo/Other  negative endocrine ROS  Renal/GU negative Renal ROS  negative genitourinary   Musculoskeletal negative musculoskeletal ROS (+)   Abdominal   Peds negative pediatric ROS (+)  Hematology negative hematology ROS (+)   Anesthesia Other Findings   Reproductive/Obstetrics (+) Pregnancy                             Anesthesia Physical Anesthesia Plan  ASA: II  Anesthesia Plan: Epidural   Post-op Pain Management:    Induction:   PONV Risk Score and Plan:   Airway Management Planned:   Additional Equipment:   Intra-op Plan:   Post-operative Plan:   Informed Consent:   Plan Discussed with:   Anesthesia Plan Comments:         Anesthesia Quick Evaluation  

## 2018-06-17 NOTE — Progress Notes (Signed)
Called to MAU by RN Ebony Hail to support Barbara Ramos.  Pt was tearful and in physical pain when I arrived.  I offered emotional support and physical presence to patient while she awaited the arrival of her partner.  Barbara Ramos reports her baby is due on March 31st and was to be named Montez Hageman.  She reports he was an active baby and she felt him moving today.  We talked a bit about what to expect after delivery.  Pt's partner arrived and pt requested space to talk.  I informed her chaplaincy care is available by request around the clock.  I will also follow up tomorrow.   Notified evening chaplain that pt is here.   Please page as further needs arise.  Maryanna Shape. Carley Hammed, M.Div. [facility] Chaplain Pager [phone] Office [phone]

## 2018-06-17 NOTE — MAU Note (Signed)
Pt states she began having UCs 1300 that did not appear to be relaxing.  Called EMS.  Upon arrival, unable to find FHR using EFM, E Lawerence NP at bedside for bedside u/s.  Dr Debroah Loop called & to bedside where POC discussed with pt re: formal bedside u/s.  Ultrasonographer to bedside.  Pt states she last felt +FM approx 1200, denies vag bleeding or LOF.  Pt uterus palpates firm upon arrival without relaxing.

## 2018-06-17 NOTE — Anesthesia Procedure Notes (Signed)
Epidural Patient location during procedure: OB Start time: 06/17/2018 5:42 PM End time: 06/17/2018 5:56 PM  Staffing Anesthesiologist: Lowella Curb, MD Performed: anesthesiologist   Preanesthetic Checklist Completed: patient identified, site marked, surgical consent, pre-op evaluation, timeout performed, IV checked, risks and benefits discussed and monitors and equipment checked  Epidural Patient position: sitting Prep: ChloraPrep Patient monitoring: heart rate, cardiac monitor, continuous pulse ox and blood pressure Approach: midline Location: L2-L3 Injection technique: LOR saline  Needle:  Needle type: Tuohy  Needle gauge: 17 G Needle length: 9 cm Needle insertion depth: 6 cm Catheter type: closed end flexible Catheter size: 20 Guage Catheter at skin depth: 10 cm Test dose: negative  Assessment Events: blood not aspirated, injection not painful, no injection resistance, negative IV test and no paresthesia  Additional Notes Reason for block:procedure for pain

## 2018-06-17 NOTE — Progress Notes (Signed)
LABOR PROGRESS NOTE  Mindy Braud is a 28 y.o. [id number] at [redacted]w[redacted]d  admitted for IOL for IUFD 2/2 placental abruption.  Subjective: Patient tearful and confused on why this happened. Reassurance given to patient. Explained plan and current labs to patient. Patient understood. Epidural placed and tolerating well. Patient is comfortable. Support system at bedside.  Objective: BP 130/76   Pulse (!) 101   Temp 98.8 F (37.1 C)   Ht 5\' 4"  (1.626 m)   Wt 73.9 kg   LMP 10/16/2017 (Exact Date)   BMI 27.97 kg/m  or  Vitals:   06/17/18 1749 06/17/18 1750 06/17/18 1752 06/17/18 1756  BP: 121/84 140/85 122/75 130/76  Pulse: 87 82 84 (!) 101  Temp:      Weight:    73.9 kg  Height:    5\' 4"  (1.626 m)   Dilation: 2 Effacement (%): 70 Cervical Position: Middle Station: -1 Presentation: Vertex Exam by:: Dr. Mauri Reading FHT: None Toco: Irregular  Labs: Lab Results  Component Value Date   WBC 14.3 (H) 06/17/2018   HGB 9.6 (L) 06/17/2018   HCT 29.5 (L) 06/17/2018   MCV 87.3 06/17/2018   PLT 115 (L) 06/17/2018   PLT 114 (L) 06/17/2018    Patient Active Problem List   Diagnosis Date Noted  . Abruptio placenta, third trimester 06/17/2018  . Supervision of other normal pregnancy, antepartum 02/21/2018  . Chlamydia infection during pregnancy 02/21/2018  . Late prenatal care affecting pregnancy, antepartum, second trimester 02/21/2018  . Positive urine pregnancy test 11/28/2017    Assessment / Plan: 28 y.o. [id number] at [redacted]w[redacted]d here for IOL for IUFD 2/2 placental abruption.   Labor: Due to abruption, will not give cytotec. FB placed.  Fetal Wellbeing:  IUFD Pain Control:  Epidural placed Anticipated MOD:  SVD Concern for DIC: Fibrinogen low at 207, elevated D-dimer of >20 with normal PT/INR. Hgb currently 9.6. Patient currently stable with no bleeding noted on cervical exam. Blood products prepared. Continue to monitor.  Orpah Cobb, D.O. [facility], PGY1 06/17/2018, 6:36  PM

## 2018-06-17 NOTE — Telephone Encounter (Signed)
Patient called in crying, reporting strong contractions -feels like they never stop. Patient reports she's home alone at this time - advised to hang up , call 911 for immediate assistance and if necessary transport to MAU. Patient stated she understood and would call 911 immediately.

## 2018-06-17 NOTE — MAU Provider Note (Signed)
Chief Complaint:  No chief complaint on file.   First Provider Initiated Contact with Patient 06/17/18 1545     HPI: Evett Seery is a 28 y.o. G3P2002 at [redacted]w[redacted]d who presents to maternity admissions via EMS reporting abdominal pain. Pain started earlier today. Pain is constant. Denies recent abdominal trauma or fall. Denies vaginal bleeding or LOF. Difficult to obtain HPI as patient is moaning in pain.   Past Medical History:  Diagnosis Date  . Allergy   . Anxiety   . Migraines    OB History  Gravida Para Term Preterm AB Living  3 2 2     2   SAB TAB Ectopic Multiple Live Births          2    # Outcome Date GA Lbr Len/2nd Weight Sex Delivery Anes PTL Lv  3 Current           2 Term 03/28/14    M Vag-Spont   LIV  1 Term 12/27/10    F Vag-Spont   LIV   Past Surgical History:  Procedure Laterality Date  . FOOT SURGERY    . WISDOM TOOTH EXTRACTION     Family History  Problem Relation Age of Onset  . Healthy Mother    Social History   Tobacco Use  . Smoking status: Former Smoker    Packs/day: 0.01    Types: Cigarettes    Last attempt to quit: 09/23/2017    Years since quitting: 0.7  . Smokeless tobacco: Never Used  Substance Use Topics  . Alcohol use: No  . Drug use: No   Allergies  Allergen Reactions  . Ibuprofen     "triggers headaches"   Medications Prior to Admission  Medication Sig Dispense Refill Last Dose  . prenatal vitamin w/FE, FA (PRENATAL 1 + 1) 27-1 MG TABS tablet Take 1 tablet by mouth daily at 12 noon.   Taking    I have reviewed patient's Past Medical Hx, Surgical Hx, Family Hx, Social Hx, medications and allergies.   ROS:  Review of Systems  Gastrointestinal: Positive for abdominal pain and nausea.  Genitourinary: Negative for vaginal bleeding and vaginal discharge.    Physical Exam  No data found.  Constitutional: Well-developed, well-nourished female. Writhing & moaning in bed Respiratory: normal effort GI: Abdomen rigid. No relaxation MS:  Extremities nontender, no edema, normal ROM Neurologic: Alert and oriented x 4.  GU:   No blood  Dilation: 1 Effacement (%): 50 Station: -3 Exam by:: Raiford Noble NP    Labs: No results found for this or any previous visit (from the past 24 hour(s)).  Imaging:  No results found.  MAU Course: Orders Placed This Encounter  Procedures  . [place] MFM OB LIMITED   No orders of the defined types were placed in this encounter.   MDM: Arrived immediately to room d/t patient yelling. Cervix checked 1/50/-3. Abdomen firm, no relaxation. Informal bedside ultrasound performed & cardiac activity not seen. Dr. Debroah Loop called to the bedside. Formal ultrasound ordered.  Assessment: 1. [redacted] weeks gestation of pregnancy   2. Sudden onset of severe abdominal pain     Plan: Dr. Debroah Loop with patient discussing results of ultrasound and POC  Judeth Horn, NP 06/17/2018 3:46 PM

## 2018-06-17 NOTE — H&P (Signed)
Barbara Ramos is a 28 y.o. female presenting for abdominal pain. She began to have symptoms at 1300 and was transported by EMS. Prenatal care at [facility] OB History    Gravida  3   Para  2   Term  2   Preterm      AB      Living  2     SAB      TAB      Ectopic      Multiple      Live Births  2          Past Medical History:  Diagnosis Date  . Allergy   . Anxiety   . Migraines    Past Surgical History:  Procedure Laterality Date  . FOOT SURGERY    . WISDOM TOOTH EXTRACTION     Family History: family history includes Healthy in her mother. Social History:  reports that she quit smoking about 8 months ago. Her smoking use included cigarettes. She smoked 0.01 packs per day. She has never used smokeless tobacco. She reports that she does not drink alcohol or use drugs.     Maternal Diabetes: No Genetic Screening: Normal Maternal Ultrasounds/Referrals: Normal Fetal Ultrasounds or other Referrals:  None Maternal Substance Abuse:  No Significant Maternal Medications:  None Significant Maternal Lab Results:  None Other Comments:  no FHT, US showed abruption and no FHM, cephalic  ROS Maternal Medical History:  Reason for admission: Vaginal bleeding.   Contractions: Onset was 1-2 hours ago.   Frequency: coupled.   Perceived severity is strong.    Fetal activity: Perceived fetal activity is none.   Last perceived fetal movement was within the past hour.    Prenatal Complications - Diabetes: none.    Dilation: 1 Effacement (%): 50 Station: -3 Exam by:: Raiford Noble NP Last menstrual period 10/16/2017. Maternal Exam:  Uterine Assessment: Contraction strength is firm.  Abdomen: Patient reports generalized tenderness.  Fetal presentation: vertex  Introitus: Normal vulva. Ferning test: not done.   Pelvis: adequate for delivery.      Physical Exam  Vitals reviewed. Constitutional: She is oriented to person, place, and time. She appears  distressed.  Neck: Normal range of motion.  Respiratory: Effort normal.  GI: There is generalized abdominal tenderness.  Genitourinary:    Vulva normal.   Neurological: She is alert and oriented to person, place, and time.  Skin: Skin is warm and dry.  Psychiatric:  crying    Prenatal labs: ABO, Rh: O/Positive/-- (10/31 1629) Antibody: Negative (10/31 1629) Rubella: 3.82 (10/31 1629) RPR: Non Reactive (01/02 1112)  HBsAg: Negative (10/31 1629)  HIV: Non Reactive (01/02 1112)  GBS:     Assessment/Plan: Placental abruption with fetal demise. She has called family to come. Admit to L&D   Scheryl Darter 06/17/2018, 3:48 PM

## 2018-06-18 ENCOUNTER — Encounter (HOSPITAL_COMMUNITY): Payer: Self-pay

## 2018-06-18 DIAGNOSIS — O4590 Premature separation of placenta, unspecified, unspecified trimester: Secondary | ICD-10-CM

## 2018-06-18 DIAGNOSIS — O4593 Premature separation of placenta, unspecified, third trimester: Secondary | ICD-10-CM

## 2018-06-18 DIAGNOSIS — Z3A35 35 weeks gestation of pregnancy: Secondary | ICD-10-CM

## 2018-06-18 DIAGNOSIS — O364XX0 Maternal care for intrauterine death, not applicable or unspecified: Secondary | ICD-10-CM

## 2018-06-18 LAB — APTT
aPTT: 28 seconds (ref 24–36)
aPTT: 38 seconds — ABNORMAL HIGH (ref 24–36)
aPTT: 33 seconds (ref 24–36)

## 2018-06-18 LAB — PROTIME-INR
INR: 1.4
INR: 1.5 — ABNORMAL HIGH (ref 0.8–1.2)
INR: 1.4 — ABNORMAL HIGH (ref 0.8–1.2)
Prothrombin Time: 16.6 seconds — ABNORMAL HIGH (ref 11.4–15.2)
Prothrombin Time: 18 seconds — ABNORMAL HIGH (ref 11.4–15.2)
Prothrombin Time: 17.1 seconds — ABNORMAL HIGH (ref 11.4–15.2)

## 2018-06-18 LAB — BPAM CRYOPRECIPITATE
BLOOD PRODUCT EXPIRATION DATE: 202002250034
Blood Product Expiration Date: 202002250034
Unit Type and Rh: 5100
Unit Type and Rh: 5100

## 2018-06-18 LAB — PREPARE CRYOPRECIPITATE
Unit division: 0
Unit division: 0

## 2018-06-18 LAB — FIBRINOGEN
FIBRINOGEN: 147 mg/dL — AB (ref 210–475)
Fibrinogen: 98 mg/dL — CL (ref 210–475)
Fibrinogen: 118 mg/dL — ABNORMAL LOW (ref 210–475)

## 2018-06-18 LAB — HEMOGLOBIN AND HEMATOCRIT, BLOOD
HCT: 21 % — ABNORMAL LOW (ref 36.0–46.0)
Hemoglobin: 7.2 g/dL — ABNORMAL LOW (ref 12.0–15.0)

## 2018-06-18 LAB — SAVE SMEAR (SSMR)

## 2018-06-18 LAB — SAVE SMEAR(SSMR), FOR PROVIDER SLIDE REVIEW

## 2018-06-18 LAB — RPR: RPR Ser Ql: NONREACTIVE

## 2018-06-18 LAB — PLATELET COUNT
Platelets: 77 10*3/uL — ABNORMAL LOW (ref 150–400)
Platelets: 101 10*3/uL — ABNORMAL LOW (ref 150–400)

## 2018-06-18 MED ORDER — MISOPROSTOL 200 MCG PO TABS
400.0000 ug | ORAL_TABLET | Freq: Once | ORAL | Status: AC
  Administered 2018-06-18: 400 ug via RECTAL

## 2018-06-18 MED ORDER — SODIUM CHLORIDE 0.9% IV SOLUTION
Freq: Once | INTRAVENOUS | Status: AC
  Administered 2018-06-18: 03:00:00 via INTRAVENOUS

## 2018-06-18 MED ORDER — MISOPROSTOL 200 MCG PO TABS
400.0000 ug | ORAL_TABLET | Freq: Once | ORAL | Status: AC
  Administered 2018-06-18: 400 ug via BUCCAL

## 2018-06-18 MED ORDER — ZOLPIDEM TARTRATE 5 MG PO TABS: 5.0000 mg | ORAL_TABLET | Freq: Every evening | ORAL | Status: DC | PRN

## 2018-06-18 MED ORDER — SENNOSIDES-DOCUSATE SODIUM 8.6-50 MG PO TABS
2.0000 | ORAL_TABLET | ORAL | Status: DC
  Filled 2018-06-18: qty 2

## 2018-06-18 MED ORDER — DIBUCAINE 1 % RE OINT
1.0000 "application " | TOPICAL_OINTMENT | RECTAL | Status: DC | PRN
  Filled 2018-06-18: qty 28

## 2018-06-18 MED ORDER — SODIUM CHLORIDE 0.9% IV SOLUTION
Freq: Once | INTRAVENOUS | Status: AC
  Administered 2018-06-18: 05:00:00 via INTRAVENOUS

## 2018-06-18 MED ORDER — TRANEXAMIC ACID-NACL 1000-0.7 MG/100ML-% IV SOLN
INTRAVENOUS | Status: AC
  Administered 2018-06-18: 1000 mg via INTRAVENOUS
  Filled 2018-06-18: qty 100

## 2018-06-18 MED ORDER — TETANUS-DIPHTH-ACELL PERTUSSIS 5-2.5-18.5 LF-MCG/0.5 IM SUSP: 0.5000 mL | Freq: Once | INTRAMUSCULAR | Status: DC

## 2018-06-18 MED ORDER — BENZOCAINE-MENTHOL 20-0.5 % EX AERO
1.0000 "application " | INHALATION_SPRAY | CUTANEOUS | Status: DC | PRN
  Administered 2018-06-18: 1 via TOPICAL
  Filled 2018-06-18: qty 56

## 2018-06-18 MED ORDER — MISOPROSTOL 200 MCG PO TABS
ORAL_TABLET | ORAL | Status: AC
  Administered 2018-06-18: 400 ug via RECTAL
  Filled 2018-06-18: qty 4

## 2018-06-18 MED ORDER — PRENATAL MULTIVITAMIN CH: 1.0000 | ORAL_TABLET | Freq: Every day | ORAL | Status: DC

## 2018-06-18 MED ORDER — ONDANSETRON HCL 4 MG/2ML IJ SOLN: 4.0000 mg | INTRAMUSCULAR | Status: DC | PRN

## 2018-06-18 MED ORDER — ONDANSETRON HCL 4 MG PO TABS: 4.0000 mg | ORAL_TABLET | ORAL | Status: DC | PRN

## 2018-06-18 MED ORDER — SIMETHICONE 80 MG PO CHEW: 80.0000 mg | CHEWABLE_TABLET | ORAL | Status: DC | PRN

## 2018-06-18 MED ORDER — ACETAMINOPHEN 325 MG PO TABS: 650.0000 mg | ORAL_TABLET | ORAL | Status: DC | PRN

## 2018-06-18 MED ORDER — TRANEXAMIC ACID-NACL 1000-0.7 MG/100ML-% IV SOLN
1000.0000 mg | Freq: Once | INTRAVENOUS | Status: AC
  Administered 2018-06-18: 1000 mg via INTRAVENOUS

## 2018-06-18 MED ORDER — DIPHENHYDRAMINE HCL 25 MG PO CAPS: 25.0000 mg | ORAL_CAPSULE | Freq: Four times a day (QID) | ORAL | Status: DC | PRN

## 2018-06-18 MED ORDER — WITCH HAZEL-GLYCERIN EX PADS: 1.0000 "application " | MEDICATED_PAD | CUTANEOUS | Status: DC | PRN

## 2018-06-18 MED ORDER — COCONUT OIL OIL
1.0000 "application " | TOPICAL_OIL | Status: DC | PRN
  Filled 2018-06-18: qty 120

## 2018-06-18 NOTE — Consult Note (Signed)
Spoke with this moms RN - and she requested the hand put for a mom with a fetal demise. LC provided both and recommended to this RN to call this LC if mom desired to have LC visit her. RN also reviewed with mom how to dry her milk up.

## 2018-06-18 NOTE — Plan of Care (Signed)
  Problem: Education: Goal: Knowledge of condition will improve Outcome: Progressing   

## 2018-06-18 NOTE — Progress Notes (Signed)
The nursing staff heard yelling coming from the room. Security was notified. Security, Advertising account executive and [employer] entered room. Patient was very upset with the FOB. FOB was escorted out of the hospital. The patient request that the father not be allowed to come back to the hospital. Security and the [employer] understand that the FOB is not to visit the patient anymore.

## 2018-06-18 NOTE — Progress Notes (Signed)
I spent time with Katherine Basset and Lilia Pro as they continued to process the loss of their baby, Mattie Marlin. They are grieving differently and I normalized this for them.  Lilia Pro does not want to see or hold baby anymore and Afiya very much wants to have him with her while she can.  They are considering cremation even though that is not something they would typically feel comfortable with.  But given the cost of burial and how spread out the family is, with Noya's family in [place], they are thinking cremation. Kelseigh's older children (dtr. 7 and son, 39) are in [place] with her mother but her mother plans to bring them down to her, she was helping out Azie during the pregnancy by keeping the kids.  She feels well supported by her family.  We will continue to check in on them as we are able, but please also page as needs arise.  Chaplain Dyanne Carrel, Bcc Pager, [phone] 1:07 PM

## 2018-06-18 NOTE — Anesthesia Postprocedure Evaluation (Signed)
Anesthesia Post Note  Patient: Barbara Ramos  Procedure(s) Performed: AN AD HOC LABOR EPIDURAL     Patient location during evaluation: Mother Baby Anesthesia Type: Epidural Level of consciousness: awake and alert and oriented Pain management: satisfactory to patient Vital Signs Assessment: post-procedure vital signs reviewed and stable Respiratory status: respiratory function stable Cardiovascular status: stable Postop Assessment: no headache, no backache, epidural receding, patient able to bend at knees, no signs of nausea or vomiting and adequate PO intake Anesthetic complications: no    Last Vitals:  Vitals:   06/18/18 0644 06/18/18 0645  BP: 127/78   Pulse: 86   Resp:    Temp:  37.1 C    Last Pain:  Vitals:   06/18/18 0645  TempSrc: Oral  PainSc: 0-No pain   Pain Goal: Patients Stated Pain Goal: 3 (06/18/18 0645)                 Karleen Dolphin

## 2018-06-19 LAB — COMPREHENSIVE METABOLIC PANEL
ALT: 12 U/L (ref 0–44)
AST: 25 U/L (ref 15–41)
Albumin: 2.6 g/dL — ABNORMAL LOW (ref 3.5–5.0)
Alkaline Phosphatase: 172 U/L — ABNORMAL HIGH (ref 38–126)
Anion gap: 8 (ref 5–15)
BUN: <5 mg/dL — ABNORMAL LOW (ref 6–20)
CHLORIDE: 104 mmol/L (ref 98–111)
CO2: 25 mmol/L (ref 22–32)
CREATININE: 0.78 mg/dL (ref 0.44–1.00)
Calcium: 8.2 mg/dL — ABNORMAL LOW (ref 8.9–10.3)
Glucose, Bld: 72 mg/dL (ref 70–99)
Potassium: 3.2 mmol/L — ABNORMAL LOW (ref 3.5–5.1)
Sodium: 137 mmol/L (ref 135–145)
Total Bilirubin: 0.7 mg/dL (ref 0.3–1.2)
Total Protein: 5.7 g/dL — ABNORMAL LOW (ref 6.5–8.1)

## 2018-06-19 LAB — PREPARE FRESH FROZEN PLASMA: Unit division: 0

## 2018-06-19 LAB — CBC
HCT: 22.1 % — ABNORMAL LOW (ref 36.0–46.0)
Hemoglobin: 7.5 g/dL — ABNORMAL LOW (ref 12.0–15.0)
MCH: 28.7 pg (ref 26.0–34.0)
MCHC: 33.9 g/dL (ref 30.0–36.0)
MCV: 84.7 fL (ref 80.0–100.0)
PLATELETS: 109 10*3/uL — AB (ref 150–400)
RBC: 2.61 MIL/uL — ABNORMAL LOW (ref 3.87–5.11)
RDW: 13.3 % (ref 11.5–15.5)
WBC: 9.9 10*3/uL (ref 4.0–10.5)
nRBC: 0 % (ref 0.0–0.2)

## 2018-06-19 LAB — PREPARE PLATELET PHERESIS: Unit division: 0

## 2018-06-19 LAB — BPAM FFP
BLOOD PRODUCT EXPIRATION DATE: 202002251834
Blood Product Expiration Date: 202002251834
Unit Type and Rh: 5100
Unit Type and Rh: 5100

## 2018-06-19 LAB — PREPARE CRYOPRECIPITATE: Unit division: 0

## 2018-06-19 LAB — BPAM CRYOPRECIPITATE
Blood Product Expiration Date: 202002251015
ISSUE DATE / TIME: 202002250453
Unit Type and Rh: 5100

## 2018-06-19 LAB — BPAM PLATELET PHERESIS
Blood Product Expiration Date: 202002262359
ISSUE DATE / TIME: 202002250237
Unit Type and Rh: 600

## 2018-06-19 MED ORDER — FERROUS SULFATE 325 (65 FE) MG PO TABS: 325.0000 mg | ORAL_TABLET | Freq: Every day | ORAL | 3 refills | Status: DC

## 2018-06-19 MED ORDER — ZOLPIDEM TARTRATE 5 MG PO TABS: 5.0000 mg | ORAL_TABLET | Freq: Every evening | ORAL | 0 refills | Status: DC | PRN

## 2018-06-19 NOTE — Progress Notes (Signed)
I offered follow up support to [facility] throughout the morning.  She reports feeling better this morning after a peaceful night with her baby.  She has spoken with FOB this morning and they are communicating better. I gave her information about [employer] support for her children when they come with her mother from [place].  We discussed how to cope with family who are reaching out in unhelpful ways as well.   She also has our phone number to call for follow up support.  Chaplain Dyanne Carrel, Bcc Pager, [phone] 11:13 AM

## 2018-06-19 NOTE — Discharge Summary (Signed)
Postpartum Discharge Summary     Patient Name: Barbara Ramos DOB: 24-Sep-1990 MRN: [id number]  Date of admission: 06/17/2018 Delivering Provider: Sharyon Cable   Date of discharge: 06/19/2018  Admitting diagnosis: 36wks CTX Intrauterine pregnancy: [redacted]w[redacted]d     Secondary diagnosis:  Active Problems:   Abruptio placenta, third trimester   SVD (spontaneous vaginal delivery)   IUFD at 20 weeks or more of gestation  Additional problems: DIC     Discharge diagnosis: Preterm Pregnancy Delivered and abruptio placentae with DIC                                                                                                Post partum procedures:blood products given  Augmentation: AROM, Pitocin, Cytotec and Foley Balloon  Complications: Placental Abruption and [facility] course:  Induction of Labor With Vaginal Delivery   28 y.o. yo [phone] at [redacted]w[redacted]d was admitted to the hospital 06/17/2018 for induction of labor.  Indication for induction: abruption and fetal demise.  Patient had an uncomplicated labor course as follows: Membrane Rupture Time/Date: 1:49 AM ,06/18/2018   Intrapartum Procedures: Episiotomy: None [1]                                         Lacerations:  None [1]  Patient had delivery of a Non Viable infant.  Information for the patient's newborn:  [name], [name] [[id number]]  Delivery Method: Vaginal, Spontaneous(Filed from Delivery Summary)   06/18/2018  Details of delivery can be found in separate delivery note.  Patient had a routine postpartum course. Patient is discharged home 06/19/18.  Magnesium Sulfate received: No BMZ received: No  Physical exam  Vitals:   06/18/18 2017 06/18/18 2320 06/19/18 0440 06/19/18 0758  BP: 110/65 (!) 100/54 (!) 97/53 113/74  Pulse: 92 78 76 86  Resp: 17 16 17 16   Temp: 98.8 F (37.1 C) 98.4 F (36.9 C) 98.5 F (36.9 C) 98.3 F (36.8 C)  TempSrc: Oral Oral Oral Oral  SpO2: 100% 100% 100% 100%  Weight:       Height:       General: alert, cooperative and no distress Lochia: appropriate Uterine Fundus: firm Incision: N/A DVT Evaluation: No evidence of DVT seen on physical exam. Labs: Lab Results  Component Value Date   WBC 9.9 06/19/2018   HGB 7.5 (L) 06/19/2018   HCT 22.1 (L) 06/19/2018   MCV 84.7 06/19/2018   PLT 109 (L) 06/19/2018   CMP Latest Ref Rng & Units 06/19/2018  Glucose 70 - 99 mg/dL 72  BUN 6 - 20 mg/dL <2(X)  Creatinine 9.37 - 1.00 mg/dL 1.69  Sodium 678 - 938 mmol/L 137  Potassium 3.5 - 5.1 mmol/L 3.2(L)  Chloride 98 - 111 mmol/L 104  CO2 22 - 32 mmol/L 25  Calcium 8.9 - 10.3 mg/dL 8.2(L)  Total Protein 6.5 - 8.1 g/dL 1.0(F)  Total Bilirubin 0.3 - 1.2 mg/dL 0.7  Alkaline Phos 38 - 126 U/L 172(H)  AST 15 - 41  U/L 25  ALT 0 - 44 U/L 12    Discharge instruction: per After Visit Summary and "Baby and Me Booklet".  After visit meds:  Allergies as of 06/19/2018      Reactions   Ibuprofen Other (See Comments)   "triggers headaches"      Medication List    TAKE these medications   ferrous sulfate 325 (65 FE) MG tablet Take 1 tablet (325 mg total) by mouth daily with breakfast.   prenatal multivitamin Tabs tablet Take 1 tablet by mouth daily at 12 noon.   zolpidem 5 MG tablet Commonly known as:  AMBIEN Take 1 tablet (5 mg total) by mouth at bedtime as needed for sleep.       Diet: routine diet  Activity: Advance as tolerated. Pelvic rest for 6 weeks.   Outpatient follow up:2 weeks Follow up Appt: Future Appointments  Date Time Provider Department Center  07/03/2018  1:30 PM Sharyon Cable, CNM [facility] None   Follow up Visit: Follow-up Information    [facility] Follow up in 2 week(s).   Specialty:  Obstetrics and Gynecology Contact information: [address], [address] [place] [address] [phone]           Please schedule this patient for Postpartum visit in: 2 weeks with the  following provider: MD For C/S patients schedule nurse incision check in weeks 2 weeks: no High risk pregnancy complicated by: fetal demise Delivery mode:  SVD Anticipated Birth Control:  other/unsure PP Procedures needed: no  Schedule Integrated BH visit: yes      Newborn Data: Live born female  Birth Weight: 5 lb 0.4 oz (2280 g) APGAR: ,   Newborn Delivery   Birth date/time:  06/18/2018 02:00:00 Delivery type:  Vaginal, Spontaneous     Disposition:morgue   06/19/2018 Scheryl Darter, MD

## 2018-06-19 NOTE — Discharge Instructions (Signed)
Vaginal Delivery, Care After °Refer to this sheet in the next few weeks. These instructions provide you with information about caring for yourself after vaginal delivery. Your health care provider may also give you more specific instructions. Your treatment has been planned according to current medical practices, but problems sometimes occur. Call your health care provider if you have any problems or questions. °What can I expect after the procedure? °After vaginal delivery, it is common to have: °· Some bleeding from your vagina. °· Soreness in your abdomen, your vagina, and the area of skin between your vaginal opening and your anus (perineum). °· Pelvic cramps. °· Fatigue. °Follow these instructions at home: °Medicines °· Take over-the-counter and prescription medicines only as told by your health care provider. °· If you were prescribed an antibiotic medicine, take it as told by your health care provider. Do not stop taking the antibiotic until it is finished. °Driving ° °· Do not drive or operate heavy machinery while taking prescription pain medicine. °· Do not drive for 24 hours if you received a sedative. °Lifestyle °· Do not drink alcohol. This is especially important if you are breastfeeding or taking medicine to relieve pain. °· Do not use tobacco products, including cigarettes, chewing tobacco, or e-cigarettes. If you need help quitting, ask your health care provider. °Eating and drinking °· Drink at least 8 eight-ounce glasses of water every day unless you are told not to by your health care provider. If you choose to breastfeed your baby, you may need to drink more water than this. °· Eat high-fiber foods every day. These foods may help prevent or relieve constipation. High-fiber foods include: °? Whole grain cereals and breads. °? Brown rice. °? Beans. °? Fresh fruits and vegetables. °Activity °· Return to your normal activities as told by your health care provider. Ask your health care provider what  activities are safe for you. °· Rest as much as possible. Try to rest or take a nap when your baby is sleeping. °· Do not lift anything that is heavier than your baby or 10 lb (4.5 kg) until your health care provider says that it is safe. °· Talk with your health care provider about when you can engage in sexual activity. This may depend on your: °? Risk of infection. °? Rate of healing. °? Comfort and desire to engage in sexual activity. °Vaginal Care °· If you have an episiotomy or a vaginal tear, check the area every day for signs of infection. Check for: °? More redness, swelling, or pain. °? More fluid or blood. °? Warmth. °? Pus or a bad smell. °· Do not use tampons or douches until your health care provider says this is safe. °· Watch for any blood clots that may pass from your vagina. These may look like clumps of dark red, brown, or black discharge. °General instructions °· Keep your perineum clean and dry as told by your health care provider. °· Wear loose, comfortable clothing. °· Wipe from front to back when you use the toilet. °· Ask your health care provider if you can shower or take a bath. If you had an episiotomy or a perineal tear during labor and delivery, your health care provider may tell you not to take baths for a certain length of time. °· Wear a bra that supports your breasts and fits you well. °· If possible, have someone help you with household activities and help care for your baby for at least a few days after you   leave the hospital.  Keep all follow-up visits for you and your baby as told by your health care provider. This is important. Contact a health care provider if:  You have: ? Vaginal discharge that has a bad smell. ? Difficulty urinating. ? Pain when urinating. ? A sudden increase or decrease in the frequency of your bowel movements. ? More redness, swelling, or pain around your episiotomy or vaginal tear. ? More fluid or blood coming from your episiotomy or vaginal  tear. ? Pus or a bad smell coming from your episiotomy or vaginal tear. ? A fever. ? A rash. ? Little or no interest in activities you used to enjoy. ? Questions about caring for yourself or your baby.  Your episiotomy or vaginal tear feels warm to the touch.  Your episiotomy or vaginal tear is separating or does not appear to be healing.  Your breasts are painful, hard, or turn red.  You feel unusually sad or worried.  You feel nauseous or you vomit.  You pass large blood clots from your vagina. If you pass a blood clot from your vagina, save it to show to your health care provider. Do not flush blood clots down the toilet without having your health care provider look at them.  You urinate more than usual.  You are dizzy or light-headed.  You have not breastfed at all and you have not had a menstrual period for 12 weeks after delivery. Get help right away if:  You have: ? Pain that does not go away or does not get better with medicine. ? Chest pain. ? Difficulty breathing. ? Blurred vision or spots in your vision.  You develop pain in your abdomen or in one of your legs.  You develop a severe headache.  You faint.  You bleed from your vagina so much that you fill two sanitary pads in one hour. This information is not intended to replace advice given to you by your health care provider. Make sure you discuss any questions you have with your health care provider. Document Released: 04/07/2000 Document Revised: 09/22/2015 Document Reviewed: 04/25/2015 Elsevier Interactive Patient Education  2019 [employer].

## 2018-06-19 NOTE — Plan of Care (Signed)
Patient given postpartum discharge instructions, follow-up appointment. Reviewed s/s to call MD and patient verbalized understanding.

## 2018-06-20 LAB — TYPE AND SCREEN
ABO/RH(D): O POS
Antibody Screen: NEGATIVE
Unit division: 0
Unit division: 0

## 2018-06-20 LAB — BPAM RBC
Blood Product Expiration Date: 202003102359
Blood Product Expiration Date: 202003102359
ISSUE DATE / TIME: 202002111845
Unit Type and Rh: 5100
Unit Type and Rh: 5100

## 2018-06-20 NOTE — Progress Notes (Signed)
WHen I returned to the patient's room after locating her memory box and her baby's molds, I could hear arguing from outside the door.  The patient was yelling "get out of my room" and her partner was yelling back that she was crazy and he needed to find his phone.  Patient continued to be upset because he took so long to return leaving her alone with their dead baby.  I asked her partner to step outside the room and found his phone for him.  I offered space for Barbara Ramos to express her feelings of grief, isolation, and frustration and validated her concerns.  I helped her to obtain the number to her room and text it to her mother so she could be reached while her phone battery was dead and I located an appropriate phone charger for her to use.  We also spent some time reviewing the items in her memory box.  When I left, patient reported she was feeling much calmer.    Please page as further needs arise.  Maryanna Shape. Carley Hammed, M.Div. [facility] Chaplain Pager [phone] Office [phone]

## 2018-06-20 NOTE — Progress Notes (Signed)
Follow up visit with Barbara Ramos.  She shared about her delivery and introduced me to Barbara Ramos.  Barbara Ramos was alone during our visit and appropriately tearful.  She was also disappointed that her partner had not returned to the hospital.  She acknowledged that he was ready for [name] to be out of the room, but stated she wanted to keep him with her until she was discharged.  She told me their plans for cremation because her family is in [place] and she wanted a way to have her Barbara with her no matter where she was.  Barbara Ramos stated that her boyfriend had been gone for a long time and that her phone was dead and she had no way to charge it or call anyone because all of her contacts had long distance numbers.  I offered to let her use my phone and her partner stated he would come back after a shower.  She was verbally distressed stating she thought he would have taken one by now.  Barbara Ramos inquired about her memory box and I told her I would attempt to locate it on birthing suites.    Please page as further needs arise.  Barbara Ramos. Carley Hammed, M.Div. [facility] Chaplain Pager [phone] Office [phone]

## 2018-06-24 ENCOUNTER — Encounter: Payer: PRIVATE HEALTH INSURANCE | Admitting: Advanced Practice Midwife

## 2018-07-03 ENCOUNTER — Ambulatory Visit: Payer: PRIVATE HEALTH INSURANCE | Admitting: Certified Nurse Midwife

## 2018-10-01 ENCOUNTER — Other Ambulatory Visit: Payer: Self-pay

## 2018-10-01 ENCOUNTER — Encounter (HOSPITAL_COMMUNITY): Payer: Self-pay | Admitting: Emergency Medicine

## 2018-10-01 ENCOUNTER — Ambulatory Visit (HOSPITAL_COMMUNITY)
Admission: EM | Admit: 2018-10-01 | Discharge: 2018-10-01 | Disposition: A | Discharge status: Home / Self Care | Payer: PRIVATE HEALTH INSURANCE | Attending: Family Medicine | Admitting: Family Medicine

## 2018-10-01 DIAGNOSIS — Z113 Encounter for screening for infections with a predominantly sexual mode of transmission: Secondary | ICD-10-CM | POA: Insufficient documentation

## 2018-10-01 NOTE — ED Triage Notes (Signed)
Pt here for STD testing; denies any symptoms

## 2018-10-01 NOTE — Discharge Instructions (Signed)
We are screening you for STDs. You can check your MyChart for results or we will call you with any positive results

## 2018-10-01 NOTE — ED Provider Notes (Signed)
[facility]    CSN: [id number] Arrival date & time: 10/01/18  1019     History   Chief Complaint Chief Complaint  Patient presents with  . Exposure to STD    HPI Barbara Ramos is a 28 y.o. female.   Patient is a 28 year old female here for routine STD testing.  She is currently not having any symptoms.  She is currently sexually active with one partner. Recent fetal demise in February. No LMP recorded.      Past Medical History:  Diagnosis Date  . Allergy   . Anxiety   . Migraines   . Sickle cell trait [facility])     Patient Active Problem List   Diagnosis Date Noted  . SVD (spontaneous vaginal delivery) 06/18/2018  . IUFD at 41 weeks or more of gestation 06/18/2018  . Abruptio placenta, third trimester 06/17/2018    Past Surgical History:  Procedure Laterality Date  . FOOT SURGERY    . WISDOM TOOTH EXTRACTION      OB History    Gravida  5   Para  3   Term  2   Preterm  1   AB  2   Living  2     SAB      TAB      Ectopic      Multiple  0   Live Births  2            Home Medications    Prior to Admission medications   Medication Sig Start Date End Date Taking? Authorizing Provider  ferrous sulfate 325 (65 FE) MG tablet Take 1 tablet (325 mg total) by mouth daily with breakfast. 06/19/18   Woodroe Mode, MD  Prenatal Vit-Fe Fumarate-FA (PRENATAL MULTIVITAMIN) TABS tablet Take 1 tablet by mouth daily at 12 noon.    [provider]  zolpidem (AMBIEN) 5 MG tablet Take 1 tablet (5 mg total) by mouth at bedtime as needed for sleep. 06/19/18   Woodroe Mode, MD    Family History Family History  Problem Relation Age of Onset  . Healthy Mother     Social History Social History   Tobacco Use  . Smoking status: Former Smoker    Packs/day: 0.01    Types: Cigarettes    Last attempt to quit: 09/23/2017    Years since quitting: 1.0  . Smokeless tobacco: Never Used  Substance Use Topics  . Alcohol use: No  . Drug use:  No     Allergies   Ibuprofen   Review of Systems Review of Systems  Genitourinary: Negative for decreased urine volume, difficulty urinating, dyspareunia, dysuria, enuresis, flank pain, frequency, genital sores, hematuria, menstrual problem, pelvic pain, urgency, vaginal bleeding, vaginal discharge and vaginal pain.     Physical Exam Triage Vital Signs ED Triage Vitals [10/01/18 1037]  Enc Vitals Group     BP 117/71     Pulse Rate 68     Resp 18     Temp 98.6 F (37 C)     Temp Source Oral     SpO2 100 %     Weight      Height      Head Circumference      Peak Flow      Pain Score 0     Pain Loc      Pain Edu?      Excl. in [place]?    No data found.  Updated Vital Signs BP 117/71 (  BP Location: Right Arm)   Pulse 68   Temp 98.6 F (37 C) (Oral)   Resp 18   SpO2 100%   Visual Acuity Right Eye Distance:   Left Eye Distance:   Bilateral Distance:    Right Eye Near:   Left Eye Near:    Bilateral Near:     Physical Exam Vitals signs and nursing note reviewed.  Constitutional:      General: She is not in acute distress.    Appearance: Normal appearance. She is not ill-appearing, toxic-appearing or diaphoretic.  HENT:     Head: Normocephalic.     Nose: Nose normal.     Mouth/Throat:     Pharynx: Oropharynx is clear.  Eyes:     Conjunctiva/sclera: Conjunctivae normal.  Neck:     Musculoskeletal: Normal range of motion.  Pulmonary:     Effort: Pulmonary effort is normal.  Abdominal:     Palpations: Abdomen is soft.     Tenderness: There is no abdominal tenderness.  Musculoskeletal: Normal range of motion.  Skin:    General: Skin is warm and dry.     Findings: No rash.  Neurological:     Mental Status: She is alert.  Psychiatric:        Mood and Affect: Mood normal.      UC Treatments / Results  Labs (all labs ordered are listed, but only abnormal results are displayed) Labs Reviewed  HIV ANTIBODY (ROUTINE TESTING W REFLEX)  RPR   CERVICOVAGINAL ANCILLARY ONLY    EKG None  Radiology No results found.  Procedures Procedures (including critical care time)  Medications Ordered in UC Medications - No data to display  Initial Impression / Assessment and Plan / UC Course  I have reviewed the triage vital signs and the nursing notes.  Pertinent labs & imaging results that were available during my care of the patient were reviewed by me and considered in my medical decision making (see chart for details).     Routine STD testing Labs pending.  Final Clinical Impressions(s) / UC Diagnoses   Final diagnoses:  Screen for STD (sexually transmitted disease)     Discharge Instructions     We are screening you for STDs. You can check your MyChart for results or we will call you with any positive results    ED Prescriptions    None     Controlled Substance Prescriptions [place] Controlled Substance Registry consulted? Not Applicable   Janace ArisBast, Marcena Dias A, NP 10/01/18 1109

## 2018-10-02 LAB — CERVICOVAGINAL ANCILLARY ONLY
Bacterial vaginitis: POSITIVE — AB
Candida vaginitis: POSITIVE — AB
Chlamydia: NEGATIVE
Neisseria Gonorrhea: NEGATIVE
Trichomonas: NEGATIVE

## 2018-10-03 ENCOUNTER — Telehealth (HOSPITAL_COMMUNITY): Payer: Self-pay | Admitting: Emergency Medicine

## 2018-10-03 MED ORDER — FLUCONAZOLE 150 MG PO TABS: 150.0000 mg | ORAL_TABLET | Freq: Once | ORAL | 0 refills | Status: AC

## 2018-10-03 MED ORDER — METRONIDAZOLE 500 MG PO TABS: 500.0000 mg | ORAL_TABLET | Freq: Two times a day (BID) | ORAL | 0 refills | Status: AC

## 2018-10-03 NOTE — Telephone Encounter (Signed)
Bacterial vaginosis is positive. This was not treated at the urgent care visit.  Flagyl 500 mg BID x 7 days #14 no refills sent to patients pharmacy of choice.    Test for candida (yeast) was positive.  Prescription for fluconazole 150mg po now, repeat dose in 3d if needed, #2 no refills, sent to the pharmacy of record.  Recheck or followup with PCP for further evaluation if symptoms are not improving.    Patient contacted and made aware of all results, all questions answered.   

## 2018-11-22 ENCOUNTER — Ambulatory Visit (HOSPITAL_COMMUNITY)
Admission: EM | Admit: 2018-11-22 | Discharge: 2018-11-22 | Disposition: A | Discharge status: Home / Self Care | Payer: PRIVATE HEALTH INSURANCE | Attending: Urgent Care | Admitting: Urgent Care

## 2018-11-22 ENCOUNTER — Other Ambulatory Visit: Payer: Self-pay

## 2018-11-22 ENCOUNTER — Encounter (HOSPITAL_COMMUNITY): Payer: Self-pay | Admitting: Emergency Medicine

## 2018-11-22 DIAGNOSIS — N760 Acute vaginitis: Secondary | ICD-10-CM | POA: Insufficient documentation

## 2018-11-22 DIAGNOSIS — N898 Other specified noninflammatory disorders of vagina: Secondary | ICD-10-CM

## 2018-11-22 DIAGNOSIS — Z3202 Encounter for pregnancy test, result negative: Secondary | ICD-10-CM | POA: Diagnosis not present

## 2018-11-22 LAB — POCT URINALYSIS DIP (DEVICE)
Bilirubin Urine: NEGATIVE
Glucose, UA: NEGATIVE mg/dL
Hgb urine dipstick: NEGATIVE
Ketones, ur: NEGATIVE mg/dL
Leukocytes,Ua: NEGATIVE
Nitrite: NEGATIVE
Protein, ur: NEGATIVE mg/dL
Specific Gravity, Urine: 1.025 (ref 1.005–1.030)
Urobilinogen, UA: 0.2 mg/dL (ref 0.0–1.0)
pH: 5.5 (ref 5.0–8.0)

## 2018-11-22 LAB — POCT PREGNANCY, URINE: Preg Test, Ur: NEGATIVE

## 2018-11-22 MED ORDER — CLINDAMYCIN HCL 300 MG PO CAPS: 300.0000 mg | ORAL_CAPSULE | Freq: Two times a day (BID) | ORAL | 0 refills | Status: DC

## 2018-11-22 MED ORDER — FLUCONAZOLE 150 MG PO TABS: 150.0000 mg | ORAL_TABLET | ORAL | 0 refills | Status: DC

## 2018-11-22 NOTE — ED Provider Notes (Signed)
MRN: [id number] DOB: [birth year]  Subjective:   Barbara SalkDawne Ramos is a 28 y.o. female presenting for check on yeast infection, pregnancy test. Patient is sexually active, does not always use condoms for protection. LMP was end of June 2020.  She has recurrent mild vaginal discharge and moderate itching.  In early June, patient was treated for both BV and yeast infection.  She took Flagyl and Diflucan but believes that she should have spaced out Diflucan more since she very easily gets yeast infections with her medications.  No current facility-administered medications for this encounter.   Current Outpatient Medications:  .  ferrous sulfate 325 (65 FE) MG tablet, Take 1 tablet (325 mg total) by mouth daily with breakfast., Disp: 30 tablet, Rfl: 3 .  Prenatal Vit-Fe Fumarate-FA (PRENATAL MULTIVITAMIN) TABS tablet, Take 1 tablet by mouth daily at 12 noon., Disp: , Rfl:  .  zolpidem (AMBIEN) 5 MG tablet, Take 1 tablet (5 mg total) by mouth at bedtime as needed for sleep., Disp: 30 tablet, Rfl: 0   Allergies  Allergen Reactions  . Ibuprofen Other (See Comments)    "triggers headaches"    Past Medical History:  Diagnosis Date  . Allergy   . Anxiety   . Migraines   . Sickle cell trait [facility](H[facility])      Past Surgical History:  Procedure Laterality Date  . FOOT SURGERY    . WISDOM TOOTH EXTRACTION      Review of Systems  Constitutional: Negative for chills, fever and malaise/fatigue.  Respiratory: Negative for cough and shortness of breath.   Cardiovascular: Negative for chest pain.  Gastrointestinal: Negative for abdominal pain, constipation, diarrhea, nausea and vomiting.  Genitourinary: Negative for dysuria, flank pain, frequency, hematuria and urgency.  Musculoskeletal: Negative for back pain and myalgias.  Skin: Negative for rash.  Neurological: Negative for dizziness and headaches.  Psychiatric/Behavioral: Negative for substance abuse.    Objective:   Vitals: BP 121/75 (BP Location:  Right Arm)   Pulse 83   Temp 98.6 F (37 C) (Oral)   Resp 18   SpO2 96%   Physical Exam Constitutional:      General: She is not in acute distress.    Appearance: Normal appearance. She is well-developed. She is not ill-appearing.  HENT:     Head: Normocephalic and atraumatic.     Nose: Nose normal.     Mouth/Throat:     Mouth: Mucous membranes are moist.     Pharynx: Oropharynx is clear.  Eyes:     General: No scleral icterus.    Extraocular Movements: Extraocular movements intact.     Pupils: Pupils are equal, round, and reactive to light.  Cardiovascular:     Rate and Rhythm: Normal rate.  Pulmonary:     Effort: Pulmonary effort is normal.  Skin:    General: Skin is warm and dry.  Neurological:     General: No focal deficit present.     Mental Status: She is alert and oriented to person, place, and time.  Psychiatric:        Mood and Affect: Mood normal.        Behavior: Behavior normal.     Results for orders placed or performed during the hospital encounter of 11/22/18 (from the past 24 hour(s))  POCT urinalysis dip (device)     Status: None   Collection Time: 11/22/18 12:51 PM  Result Value Ref Range   Glucose, UA NEGATIVE NEGATIVE mg/dL   Bilirubin Urine NEGATIVE NEGATIVE  Ketones, ur NEGATIVE NEGATIVE mg/dL   Specific Gravity, Urine 1.025 1.005 - 1.030   Hgb urine dipstick NEGATIVE NEGATIVE   pH 5.5 5.0 - 8.0   Protein, ur NEGATIVE NEGATIVE mg/dL   Urobilinogen, UA 0.2 0.0 - 1.0 mg/dL   Nitrite NEGATIVE NEGATIVE   Leukocytes,Ua NEGATIVE NEGATIVE  Pregnancy, urine POC     Status: None   Collection Time: 11/22/18 12:53 PM  Result Value Ref Range   Preg Test, Ur NEGATIVE NEGATIVE    Assessment and Plan :   1. Acute vaginitis   2. Vaginal discharge     We will switch to clindamycin for this particular episode, space out Diflucan by waiting 1 week to start the 2 doses.  Labs pending. Counseled patient on potential for adverse effects with medications  prescribed/recommended today, ER and return-to-clinic precautions discussed, patient verbalized understanding.    Jaynee Eagles, [place] 11/22/18 7846

## 2018-11-22 NOTE — ED Triage Notes (Signed)
Pt requests pregnancy test and also sts thinks she has yeast infection

## 2018-11-28 ENCOUNTER — Encounter: Payer: PRIVATE HEALTH INSURANCE | Admitting: Family Medicine

## 2018-11-29 ENCOUNTER — Encounter: Payer: Self-pay | Admitting: Family Medicine

## 2018-12-02 LAB — CERVICOVAGINAL ANCILLARY ONLY
Bacterial vaginitis: POSITIVE — AB
Candida vaginitis: NEGATIVE
Chlamydia: NEGATIVE
Neisseria Gonorrhea: NEGATIVE
Trichomonas: NEGATIVE

## 2018-12-17 ENCOUNTER — Emergency Department (HOSPITAL_COMMUNITY)
Admission: EM | Admit: 2018-12-17 | Discharge: 2018-12-17 | Disposition: A | Discharge status: Home / Self Care | Payer: PRIVATE HEALTH INSURANCE | Attending: Emergency Medicine | Admitting: Emergency Medicine

## 2018-12-17 ENCOUNTER — Encounter (HOSPITAL_COMMUNITY): Payer: Self-pay | Admitting: Emergency Medicine

## 2018-12-17 ENCOUNTER — Other Ambulatory Visit: Payer: Self-pay

## 2018-12-17 DIAGNOSIS — M7918 Myalgia, other site: Secondary | ICD-10-CM

## 2018-12-17 DIAGNOSIS — M79604 Pain in right leg: Secondary | ICD-10-CM | POA: Diagnosis not present

## 2018-12-17 DIAGNOSIS — R6884 Jaw pain: Secondary | ICD-10-CM | POA: Insufficient documentation

## 2018-12-17 DIAGNOSIS — R51 Headache: Secondary | ICD-10-CM | POA: Diagnosis present

## 2018-12-17 DIAGNOSIS — W2210XA Striking against or struck by unspecified automobile airbag, initial encounter: Secondary | ICD-10-CM | POA: Diagnosis not present

## 2018-12-17 DIAGNOSIS — Y9389 Activity, other specified: Secondary | ICD-10-CM | POA: Insufficient documentation

## 2018-12-17 DIAGNOSIS — Z79899 Other long term (current) drug therapy: Secondary | ICD-10-CM | POA: Diagnosis not present

## 2018-12-17 DIAGNOSIS — Y998 Other external cause status: Secondary | ICD-10-CM | POA: Diagnosis not present

## 2018-12-17 DIAGNOSIS — Z87891 Personal history of nicotine dependence: Secondary | ICD-10-CM | POA: Insufficient documentation

## 2018-12-17 DIAGNOSIS — Y92410 Unspecified street and highway as the place of occurrence of the external cause: Secondary | ICD-10-CM | POA: Diagnosis not present

## 2018-12-17 MED ORDER — METHOCARBAMOL 500 MG PO TABS: 1000.0000 mg | ORAL_TABLET | Freq: Four times a day (QID) | ORAL | 0 refills | Status: DC | PRN

## 2018-12-17 NOTE — Discharge Instructions (Signed)
Please follow with your primary care doctor in the next 2 days for a check-up. They must obtain records for further management.  ° °Do not hesitate to return to the Emergency Department for any new, worsening or concerning symptoms.  ° °
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
## 2019-01-01 ENCOUNTER — Other Ambulatory Visit: Payer: Self-pay

## 2019-01-01 ENCOUNTER — Ambulatory Visit (HOSPITAL_COMMUNITY)
Admission: EM | Admit: 2019-01-01 | Discharge: 2019-01-01 | Disposition: A | Discharge status: Home / Self Care | Payer: PRIVATE HEALTH INSURANCE | Attending: Family Medicine | Admitting: Family Medicine

## 2019-01-01 ENCOUNTER — Encounter (HOSPITAL_COMMUNITY): Payer: Self-pay

## 2019-01-01 DIAGNOSIS — Z202 Contact with and (suspected) exposure to infections with a predominantly sexual mode of transmission: Secondary | ICD-10-CM

## 2019-01-01 DIAGNOSIS — R1030 Lower abdominal pain, unspecified: Secondary | ICD-10-CM | POA: Diagnosis not present

## 2019-01-01 DIAGNOSIS — N898 Other specified noninflammatory disorders of vagina: Secondary | ICD-10-CM | POA: Diagnosis not present

## 2019-01-01 DIAGNOSIS — Z113 Encounter for screening for infections with a predominantly sexual mode of transmission: Secondary | ICD-10-CM | POA: Diagnosis not present

## 2019-01-01 MED ORDER — FLUCONAZOLE 150 MG PO TABS: 150.0000 mg | ORAL_TABLET | Freq: Every day | ORAL | 0 refills | Status: DC

## 2019-01-01 MED ORDER — METRONIDAZOLE 500 MG PO TABS: 500.0000 mg | ORAL_TABLET | Freq: Two times a day (BID) | ORAL | 0 refills | Status: DC

## 2019-01-01 NOTE — ED Triage Notes (Signed)
Pt presents with lower abdominal pain and vaginal itching. Reports that her partner tested positive for trichomonas.

## 2019-01-01 NOTE — ED Provider Notes (Signed)
[facility]    CSN: [id number] Arrival date & time: 01/01/19  0844      History   Chief Complaint Chief Complaint  Patient presents with   Pelvic Pain   Vaginal Itching    HPI Barbara Ramos is a 28 y.o. female.   Patient is a 28 year old female past medical history of anxiety, allergies, migraine, sickle cell trait.  She presents today with low abdominal discomfort, vaginal discharge, itching and irritation.  Reporting she was exposed to trichomonas.  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  No current pain now just intermittent abdominal cramping. Patient's last menstrual period was 12/17/2018.  No back pain, fever, dysuria, hematuria or urinary frequency.  ROS per HPI       Past Medical History:  Diagnosis Date   Allergy    Anxiety    Migraines    Sickle cell trait [facility])     Patient Active Problem List   Diagnosis Date Noted   SVD (spontaneous vaginal delivery) 06/18/2018   IUFD at 20 weeks or more of gestation 06/18/2018   Abruptio placenta, third trimester 06/17/2018    Past Surgical History:  Procedure Laterality Date   FOOT SURGERY     WISDOM TOOTH EXTRACTION      OB History    Gravida  5   Para  3   Term  2   Preterm  1   AB  2   Living  2     SAB      TAB      Ectopic      Multiple  0   Live Births  2            Home Medications    Prior to Admission medications   Medication Sig Start Date End Date Taking? Authorizing Provider  ferrous sulfate 325 (65 FE) MG tablet Take 1 tablet (325 mg total) by mouth daily with breakfast. 06/19/18   Adam PhenixArnold, James G, MD  fluconazole (DIFLUCAN) 150 MG tablet Take 1 tablet (150 mg total) by mouth daily. 01/01/19   Dahlia ByesBast, Cristina Ceniceros A, NP  methocarbamol (ROBAXIN) 500 MG tablet Take 2 tablets (1,000 mg total) by mouth 4 (four) times daily as needed (Pain). 12/17/18   Pisciotta, Joni ReiningNicole, PA-C  metroNIDAZOLE (FLAGYL) 500 MG tablet Take 1 tablet (500 mg total) by mouth 2 (two) times  daily. 01/01/19   Janace ArisBast, Suellen Durocher A, NP  Prenatal Vit-Fe Fumarate-FA (PRENATAL MULTIVITAMIN) TABS tablet Take 1 tablet by mouth daily at 12 noon.    [provider]  zolpidem (AMBIEN) 5 MG tablet Take 1 tablet (5 mg total) by mouth at bedtime as needed for sleep. 06/19/18   Adam PhenixArnold, James G, MD    Family History Family History  Problem Relation Age of Onset   Healthy Mother     Social History Social History   Tobacco Use   Smoking status: Former Smoker    Packs/day: 0.01    Types: Cigarettes    Quit date: 09/23/2017    Years since quitting: 1.2   Smokeless tobacco: Never Used  Substance Use Topics   Alcohol use: No   Drug use: No     Allergies   Ibuprofen   Review of Systems Review of Systems   Physical Exam Triage Vital Signs ED Triage Vitals  Enc Vitals Group     BP 01/01/19 0911 118/71     Pulse Rate 01/01/19 0911 60     Resp 01/01/19 0911 17  Temp 01/01/19 0911 97.9 F (36.6 C)     Temp Source 01/01/19 0911 Oral     SpO2 01/01/19 0911 100 %     Weight --      Height --      Head Circumference --      Peak Flow --      Pain Score 01/01/19 0923 4     Pain Loc --      Pain Edu? --      Excl. in [place]? --    No data found.  Updated Vital Signs BP 118/71 (BP Location: Right Arm)    Pulse 60    Temp 97.9 F (36.6 C) (Oral)    Resp 17    LMP 12/17/2018    SpO2 100%   Visual Acuity Right Eye Distance:   Left Eye Distance:   Bilateral Distance:    Right Eye Near:   Left Eye Near:    Bilateral Near:     Physical Exam Vitals signs and nursing note reviewed.  Constitutional:      General: She is not in acute distress.    Appearance: Normal appearance. She is not ill-appearing, toxic-appearing or diaphoretic.  HENT:     Head: Normocephalic.     Nose: Nose normal.     Mouth/Throat:     Pharynx: Oropharynx is clear.  Eyes:     Conjunctiva/sclera: Conjunctivae normal.  Neck:     Musculoskeletal: Normal range of motion.  Pulmonary:      Effort: Pulmonary effort is normal.  Abdominal:     Palpations: Abdomen is soft.     Tenderness: There is no abdominal tenderness.  Musculoskeletal: Normal range of motion.  Skin:    General: Skin is warm and dry.     Findings: No rash.  Neurological:     Mental Status: She is alert.  Psychiatric:        Mood and Affect: Mood normal.      UC Treatments / Results  Labs (all labs ordered are listed, but only abnormal results are displayed) Labs Reviewed  CERVICOVAGINAL ANCILLARY ONLY    EKG   Radiology No results found.  Procedures Procedures (including critical care time)  Medications Ordered in UC Medications - No data to display  Initial Impression / Assessment and Plan / UC Course  I have reviewed the triage vital signs and the nursing notes.  Pertinent labs & imaging results that were available during my care of the patient were reviewed by me and considered in my medical decision making (see chart for details).     Vaginal discharge and STD exposure.  Treating for BV, trichomonas and yeast based on history and symptoms. Swab sent for testing Final Clinical Impressions(s) / UC Diagnoses   Final diagnoses:  STD exposure  Vaginal discharge     Discharge Instructions     Treating for bacterial vaginosis, trichomonas and yeast infection. Take the medication as prescribed with food. We will send your swab for testing and call you with any positive results   ED Prescriptions    Medication Sig Dispense Auth. Provider   metroNIDAZOLE (FLAGYL) 500 MG tablet Take 1 tablet (500 mg total) by mouth 2 (two) times daily. 14 tablet Carley Glendenning A, NP   fluconazole (DIFLUCAN) 150 MG tablet Take 1 tablet (150 mg total) by mouth daily. 2 tablet Loura Halt A, NP     Controlled Substance Prescriptions [place] Controlled Substance Registry consulted? Not Applicable   Orvan July, NP  01/01/19 1025 ° °

## 2019-01-01 NOTE — Discharge Instructions (Addendum)
Treating for bacterial vaginosis, trichomonas and yeast infection. Take the medication as prescribed with food. We will send your swab for testing and call you with any positive results

## 2019-01-03 LAB — CERVICOVAGINAL ANCILLARY ONLY
Bacterial vaginitis: POSITIVE — AB
Candida vaginitis: POSITIVE — AB
Chlamydia: NEGATIVE
Neisseria Gonorrhea: NEGATIVE
Trichomonas: NEGATIVE

## 2019-03-29 ENCOUNTER — Emergency Department (HOSPITAL_COMMUNITY): Payer: BLUE CROSS/BLUE SHIELD

## 2019-03-29 ENCOUNTER — Emergency Department (HOSPITAL_COMMUNITY)
Admission: EM | Admit: 2019-03-29 | Discharge: 2019-03-29 | Disposition: A | Discharge status: Home / Self Care | Payer: BLUE CROSS/BLUE SHIELD | Attending: Emergency Medicine | Admitting: Emergency Medicine

## 2019-03-29 ENCOUNTER — Other Ambulatory Visit: Payer: Self-pay

## 2019-03-29 ENCOUNTER — Encounter (HOSPITAL_COMMUNITY): Payer: Self-pay | Admitting: Emergency Medicine

## 2019-03-29 DIAGNOSIS — Z79899 Other long term (current) drug therapy: Secondary | ICD-10-CM | POA: Insufficient documentation

## 2019-03-29 DIAGNOSIS — Z87891 Personal history of nicotine dependence: Secondary | ICD-10-CM | POA: Diagnosis not present

## 2019-03-29 DIAGNOSIS — R519 Headache, unspecified: Secondary | ICD-10-CM | POA: Insufficient documentation

## 2019-03-29 DIAGNOSIS — Y9389 Activity, other specified: Secondary | ICD-10-CM | POA: Diagnosis not present

## 2019-03-29 DIAGNOSIS — M25561 Pain in right knee: Secondary | ICD-10-CM | POA: Insufficient documentation

## 2019-03-29 DIAGNOSIS — Y92410 Unspecified street and highway as the place of occurrence of the external cause: Secondary | ICD-10-CM | POA: Insufficient documentation

## 2019-03-29 DIAGNOSIS — Y999 Unspecified external cause status: Secondary | ICD-10-CM | POA: Insufficient documentation

## 2019-03-29 MED ORDER — ACETAMINOPHEN 325 MG PO TABS
650.0000 mg | ORAL_TABLET | Freq: Once | ORAL | Status: AC
  Administered 2019-03-29: 650 mg via ORAL
  Filled 2019-03-29: qty 2
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
## 2019-03-29 NOTE — Discharge Instructions (Signed)
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
## 2019-04-04 ENCOUNTER — Ambulatory Visit (INDEPENDENT_AMBULATORY_CARE_PROVIDER_SITE_OTHER): Payer: Medicaid Other | Admitting: Medical

## 2019-04-04 ENCOUNTER — Other Ambulatory Visit: Payer: Self-pay

## 2019-04-04 ENCOUNTER — Other Ambulatory Visit (HOSPITAL_COMMUNITY)
Admission: RE | Admit: 2019-04-04 | Discharge: 2019-04-04 | Disposition: A | Discharge status: Home / Self Care | Payer: BLUE CROSS/BLUE SHIELD | Source: Ambulatory Visit | Attending: Medical | Admitting: Medical

## 2019-04-04 ENCOUNTER — Encounter: Payer: Self-pay | Admitting: Medical

## 2019-04-04 VITALS — BP 106/72 | HR 94 | Temp 97.9°F | Ht 64.0 in | Wt 135.7 lb

## 2019-04-04 DIAGNOSIS — Z01419 Encounter for gynecological examination (general) (routine) without abnormal findings: Secondary | ICD-10-CM | POA: Insufficient documentation

## 2019-04-04 DIAGNOSIS — Z0000 Encounter for general adult medical examination without abnormal findings: Secondary | ICD-10-CM

## 2019-04-04 NOTE — Progress Notes (Signed)
History:  [name] is a 28 y.o. [phone] who presents to clinic today for annual exam. The patient had a normal pap smear at PCP 10/17/17. She denies history of abnormal pap. She does not wish to discuss birth control as she is planning to attempt to conceive soon. She was advised to start prenatal vitamins. She has no other GYN concerns.    The following portions of the patient's history were reviewed and updated as appropriate: allergies, current medications, family history, past medical history, social history, past surgical history and problem list.  Review of Systems:  Review of Systems  Constitutional: Negative for fever.  Gastrointestinal: Negative for abdominal pain.  Genitourinary: Negative for dysuria, frequency and urgency.       Neg - vaginal bleeding, discharge      Objective:  Physical Exam BP 106/72   Pulse 94   Temp 97.9 F (36.6 C)   Ht 5\' 4"  (1.626 m)   Wt 135 lb 11.2 oz (61.6 kg)   LMP 04/01/2019 (Exact Date)   BMI 23.29 kg/m  Physical Exam  Nursing note and vitals reviewed. Constitutional: She is oriented to person, place, and time. She appears well-developed and well-nourished. No distress.  HENT:  Head: Normocephalic and atraumatic.  Eyes: Conjunctivae and EOM are normal.  Neck: No thyromegaly present.  Cardiovascular: Normal rate, regular rhythm and normal heart sounds.  No murmur heard. Respiratory: Effort normal and breath sounds normal. No respiratory distress. She has no wheezes.  GI: Soft. Bowel sounds are normal. She exhibits no distension and no mass. There is no abdominal tenderness. There is no rebound and no guarding.  Genitourinary: Uterus is not enlarged and not tender. Cervix exhibits no motion tenderness, no discharge and no friability. Right adnexum displays no mass and no tenderness. Left adnexum displays no mass and no tenderness.    No vaginal discharge or bleeding.  No bleeding in the vagina.  Musculoskeletal:     Cervical back:  Normal range of motion and neck supple.  Neurological: She is alert and oriented to person, place, and time.  Skin: Skin is warm and dry. No erythema.  Psychiatric: She has a normal mood and affect.    Assessment & Plan:  1. Women's annual routine gynecological examination - HIV antibody (with reflex) - RPR - Cervicovaginal ancillary only( [place]) - Advised to start taking PNV - RTC in 1 year for annual exam or sooner if +UPT - Will need early [facility] due to history of IUFD  Barbara Ramos 04/04/2019 10:29 AM

## 2019-04-04 NOTE — Patient Instructions (Signed)
Natural Family Planning  Natural Family Planning (NFP) is a type of birth control (contraception) in which no form of contraceptive medicine or device is used. The NFP method relies on knowing which days of the month a woman's ovary is producing an egg (ovulation). Ovulation is the time in the menstrual cycle when a woman is most fertile and, therefore, most likely to become pregnant. To lower the chance of pregnancy, sex is avoided during ovulation. NFP is a safe method of birth control and can prevent pregnancy if it is done correctly. However, NFP does not provide protection from sexually transmitted diseases. NFP can also be used as a method of getting pregnant, by deciding to have sex during ovulation. How does the NFP method work? NFP works by making both sexual partners aware of how the woman's body functions during her menstrual cycle.  Usually, a woman has a menstrual period every 28-30 days. However, there can be 23-35 days between each menstrual period. This varies for each woman. A woman with a 28-day menstrual cycle has about 6 days a month when she is most likely to get pregnant.  Ovulation happens 12-14 days before the start of the next menstrual period. There are different methods that are used to determine when ovulation starts.  An egg is fertile for 24 hours after it is released from the ovary. Sperm can live for 3 days or more. What NFP methods can be used to prevent pregnancy? The basal body temperature method During ovulation, there is often a slight increase in body temperature. To use this method:  Take your temperature every morning before getting out of bed. Write the temperature on a chart.  Do not have sex from the day the menstrual periods starts until 3 days after the increase in temperature. Note that body temperature may increase as a result of various factors, including fever, restless sleep, and working schedules. The cervical mucus method Right before  ovulation, mucus from the lower part of the uterus (cervix) changes from dry and sticky to wet and slippery. To use this method:  Check the mucus every day to look for these changes. Ovulation happens on the last day of wet, slippery mucus.  Do not have sex starting when you first see wet, slippery mucus and until 4 days after it stops or returns to its normal consistency.  With this method, it is safe to have sex: ? After the 4 days have passed, and until 10 days after the menstrual period starts. ? On days when mucus is dry. Note that cervical mucus can increase or change consistency due to reasons other than ovulation, such as infection, lubricants, some medicines, and sexual arousal. Other variations of the cervical mucus method include the TwoDay method, [place] ovulation method, and the [employer]. The symptothermal method This method combines the basal body temperature and the cervical mucus methods. The calendar method This method involves tracking menstrual cycles to determine when ovulation occurs. This method is helpful when the menstrual cycle varies in length. To use this method:  For 6 months, record when menstrual periods start and end and the length of each menstrual cycle. The length of a menstrual cycle is from day 1 of the present menstrual period to day 1 of the next menstrual period.  Use this information to determine when you will likely ovulate. Avoid sex during that time. You may need help from your health care provider to determine which days you are most likely to get pregnant. ?  Ovulation usually happens 12-14 days before the start of the next menstrual period. ? Light vaginal bleeding (spotting) or abdominal cramps during the middle of a menstrual cycle may be signs of ovulation. However, not all women have these symptoms. The standard days method This method is based on studies of women's hormone levels throughout normal menstrual cycles. Based on these  studies, a standard rule was developed to predict when women are most fertile during their menstrual cycle. According to the rule, if your cycle is 26-32 days long, you are most fertile between days 8 and 19. To prevent pregnancy, you should avoid having sex during this time, or use a barrier method of birth control. This method works best if your cycle is regularly between 26-32 days long. When should I not use the NFP method? You should not use NFP if:  You have very irregular menstrual periods or you sometimes skip a menstrual period.  You have abnormal vaginal bleeding.  You recently had a baby or are breastfeeding.  You have a vaginal or cervical infection.  You take medicines that can affect vaginal mucus or body temperature. These medicines include antibiotics, thyroid medicines, and antihistamines that are found in some cold and allergy medicines.  You absolutely do not want to become pregnant at the current time. Other methods of contraception are more effective at preventing pregnancy than NFP.  You are concerned about sexually transmitted diseases. Summary  Natural Family Planning methods help women and their partners to understand how to avoid pregnancy without using medicines or other methods.  A woman learns to recognize her most fertile days. A woman with a 28 day menstrual cycle has about 6 days per month when she can get pregnant. This information is not intended to replace advice given to you by your health care provider. Make sure you discuss any questions you have with your health care provider. Document Released: 09/27/2007 Document Revised: 08/02/2018 Document Reviewed: 05/29/2016 Elsevier Patient Education  2020 [employer]. Pap Test Why am I having this test? A Pap test, also called a Pap smear, is a screening test to check for signs of:  Cancer of the vagina, cervix, and uterus. The cervix is the lower part of the uterus that opens into the  vagina.  Infection.  Changes that may be a sign that cancer is developing (precancerous changes). Women need this test on a regular basis. In general, you should have a Pap test every 3 years until you reach menopause or age 29. Women aged 30-60 may choose to have their Pap test done at the same time as an HPV (human papillomavirus) test every 5 years (instead of every 3 years). Your health care provider may recommend having Pap tests more or less often depending on your medical conditions and past Pap test results. What kind of sample is taken?  Your health care provider will collect a sample of cells from the surface of your cervix. This will be done using a small cotton swab, plastic spatula, or brush. This sample is often collected during a pelvic exam, when you are lying on your back on an exam table with feet in footrests (stirrups). In some cases, fluids (secretions) from the cervix or vagina may also be collected. How do I prepare for this test?  Be aware of where you are in your menstrual cycle. If you are menstruating on the day of the test, you may be asked to reschedule.  You may need to reschedule if you have  a known vaginal infection on the day of the test.  Follow instructions from your health care provider about: ? Changing or stopping your regular medicines. Some medicines can cause abnormal test results, such as digitalis and tetracycline. ? Avoiding douching or taking a bath the day before or the day of the test. Tell a health care provider about:  Any allergies you have.  All medicines you are taking, including vitamins, herbs, eye drops, creams, and over-the-counter medicines.  Any blood disorders you have.  Any surgeries you have had.  Any medical conditions you have.  Whether you are pregnant or may be pregnant. How are the results reported? Your test results will be reported as either abnormal or normal. A false-positive result can occur. A false positive is  incorrect because it means that a condition is present when it is not. A false-negative result can occur. A false negative is incorrect because it means that a condition is not present when it is. What do the results mean? A normal test result means that you do not have signs of cancer of the vagina, cervix, or uterus. An abnormal result may mean that you have:  Cancer. A Pap test by itself is not enough to diagnose cancer. You will have more tests done in this case.  Precancerous changes in your vagina, cervix, or uterus.  Inflammation of the cervix.  An STD (sexually transmitted disease).  A fungal infection.  A parasite infection. Talk with your health care provider about what your results mean. Questions to ask your health care provider Ask your health care provider, or the department that is doing the test:  When will my results be ready?  How will I get my results?  What are my treatment options?  What other tests do I need?  What are my next steps? Summary  In general, women should have a Pap test every 3 years until they reach menopause or age 21.  Your health care provider will collect a sample of cells from the surface of your cervix. This will be done using a small cotton swab, plastic spatula, or brush.  In some cases, fluids (secretions) from the cervix or vagina may also be collected. This information is not intended to replace advice given to you by your health care provider. Make sure you discuss any questions you have with your health care provider. Document Released: 07/01/2002 Document Revised: 12/18/2016 Document Reviewed: 12/18/2016 Elsevier Patient Education  2020 [employer].

## 2019-04-04 NOTE — Progress Notes (Signed)
GYN presents for AEX.  Last PAP 10/17/17.  She is not using BC she is trying to conceive.  PHQ-9=5

## 2019-04-07 ENCOUNTER — Encounter: Payer: Self-pay | Admitting: Medical

## 2019-04-07 DIAGNOSIS — Z8619 Personal history of other infectious and parasitic diseases: Secondary | ICD-10-CM | POA: Insufficient documentation

## 2019-04-07 LAB — RPR: RPR Ser Ql: REACTIVE — AB

## 2019-04-07 LAB — CERVICOVAGINAL ANCILLARY ONLY
Chlamydia: NEGATIVE
Comment: NEGATIVE
Comment: NORMAL
Neisseria Gonorrhea: NEGATIVE

## 2019-04-07 LAB — RPR, QUANT+TP ABS (REFLEX)
Rapid Plasma Reagin, Quant: 1:1 {titer} — ABNORMAL HIGH
T Pallidum Abs: NONREACTIVE

## 2019-04-07 LAB — HIV ANTIBODY (ROUTINE TESTING W REFLEX): HIV Screen 4th Generation wRfx: NONREACTIVE

## 2019-04-08 ENCOUNTER — Encounter: Payer: Self-pay | Admitting: *Deleted

## 2019-04-10 ENCOUNTER — Encounter: Payer: Self-pay | Admitting: Medical

## 2019-04-10 DIAGNOSIS — R768 Other specified abnormal immunological findings in serum: Secondary | ICD-10-CM | POA: Insufficient documentation

## 2019-04-11 ENCOUNTER — Ambulatory Visit: Payer: Self-pay | Admitting: Medical

## 2019-04-11 ENCOUNTER — Other Ambulatory Visit: Payer: Self-pay

## 2019-06-07 ENCOUNTER — Encounter (HOSPITAL_COMMUNITY): Payer: Self-pay

## 2019-06-07 ENCOUNTER — Ambulatory Visit (HOSPITAL_COMMUNITY)
Admission: EM | Admit: 2019-06-07 | Discharge: 2019-06-07 | Disposition: A | Discharge status: Home / Self Care | Payer: Medicaid Other | Attending: Physician Assistant | Admitting: Physician Assistant

## 2019-06-07 ENCOUNTER — Other Ambulatory Visit: Payer: Self-pay

## 2019-06-07 DIAGNOSIS — R05 Cough: Secondary | ICD-10-CM | POA: Insufficient documentation

## 2019-06-07 DIAGNOSIS — J040 Acute laryngitis: Secondary | ICD-10-CM

## 2019-06-07 DIAGNOSIS — L709 Acne, unspecified: Secondary | ICD-10-CM | POA: Insufficient documentation

## 2019-06-07 DIAGNOSIS — Z20822 Contact with and (suspected) exposure to covid-19: Secondary | ICD-10-CM

## 2019-06-07 DIAGNOSIS — Z113 Encounter for screening for infections with a predominantly sexual mode of transmission: Secondary | ICD-10-CM | POA: Insufficient documentation

## 2019-06-07 DIAGNOSIS — U071 COVID-19: Secondary | ICD-10-CM | POA: Insufficient documentation

## 2019-06-07 DIAGNOSIS — R059 Cough, unspecified: Secondary | ICD-10-CM

## 2019-06-07 LAB — HIV ANTIBODY (ROUTINE TESTING W REFLEX): HIV Screen 4th Generation wRfx: NONREACTIVE

## 2019-06-07 MED ORDER — BENZONATATE 100 MG PO CAPS: 100.0000 mg | ORAL_CAPSULE | Freq: Three times a day (TID) | ORAL | 0 refills | Status: DC

## 2019-06-07 MED ORDER — PREDNISONE 10 MG (21) PO TBPK: ORAL_TABLET | ORAL | 0 refills | Status: AC

## 2019-06-07 NOTE — ED Provider Notes (Signed)
[place]    CSN: [id number] Arrival date & time: 06/07/19  1518      History   Chief Complaint Chief Complaint  Patient presents with  . Cough  . SEXUALLY TRANSMITTED DISEASE    HPI Barbara Ramos is a 29 y.o. female.   Patient reports to urgent care today for  Dry cough, loss of voice and request for STD testing. She reports for the last 2-3 days she has had a dry cough and has been gradually losing her voice. She denies sore throat, fever, chills, shortness of breath and headache. She is concerned about her voice due to her job requiring frequent communication. She does not have known sick contacts and otherwise does not feel that poorly.  She is also requesting STD testing as she has concern about a "fishy odor after sex". She denies vagina discharge, irritation or pain. The odor is only present after sexual intercourse.   She also has concern of recent Acne break out on her chest and neck. She reports having issues with acne when she was younger. She endorses a lot of stress recently. Some of the lesions do end up pustular and some do not. The acne on her neck can be painful.  She has not taken medications for any of her symptoms.      Past Medical History:  Diagnosis Date  . Allergy   . Anxiety   . Migraines   . Sickle cell trait [facility])     Patient Active Problem List   Diagnosis Date Noted  . Biological false positive RPR test 04/10/2019  . 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 SVD (spontaneous vaginal delivery) 06/18/2018  . IUFD at 40 weeks or more of gestation 06/18/2018  . Abruptio placenta, third trimester 06/17/2018    Past Surgical History:  Procedure Laterality Date  . FOOT SURGERY    . WISDOM TOOTH EXTRACTION      OB History    Gravida  5   Para  3   Term  2   Preterm  1   AB  2   Living  2     SAB      TAB      Ectopic      Multiple  0   Live Births  2            Home Medications    Prior to Admission  medications   Medication Sig Start Date End Date Taking? Authorizing Provider  benzonatate (TESSALON) 100 MG capsule Take 1 capsule (100 mg total) by mouth every 8 (eight) hours. 06/07/19   Lawerence Dery, Marguerita Beards, PA-C  ferrous sulfate 325 (65 FE) MG tablet Take 1 tablet (325 mg total) by mouth daily with breakfast. Patient not taking: Reported on 04/04/2019 06/19/18   Woodroe Mode, MD  fluconazole (DIFLUCAN) 150 MG tablet Take 1 tablet (150 mg total) by mouth daily. Patient not taking: Reported on 04/04/2019 01/01/19   Loura Halt A, NP  methocarbamol (ROBAXIN) 500 MG tablet Take 2 tablets (1,000 mg total) by mouth 4 (four) times daily as needed (Pain). Patient not taking: Reported on 04/04/2019 12/17/18   Pisciotta, Elmyra Ricks, PA-C  metroNIDAZOLE (FLAGYL) 500 MG tablet Take 1 tablet (500 mg total) by mouth 2 (two) times daily. Patient not taking: Reported on 04/04/2019 01/01/19   Loura Halt A, NP  predniSONE (STERAPRED UNI-PAK 21 TAB) 10 MG (21) TBPK tablet Take 6 tablets (60 mg total) by mouth daily for 1 day, THEN  5 tablets (50 mg total) daily for 1 day, THEN 4 tablets (40 mg total) daily for 1 day, THEN 3 tablets (30 mg total) daily for 1 day, THEN 2 tablets (20 mg total) daily for 1 day, THEN 1 tablet (10 mg total) daily for 1 day. 06/07/19 06/13/19  Caidynce Muzyka, Veryl Speak, PA-C  Prenatal Vit-Fe Fumarate-FA (PRENATAL MULTIVITAMIN) TABS tablet Take 1 tablet by mouth daily at 12 noon.    [provider]    Family History Family History  Problem Relation Age of Onset  . Healthy Mother   . Healthy Father     Social History Social History   Tobacco Use  . Smoking status: Former Smoker    Packs/day: 0.01    Types: Cigarettes    Quit date: 09/23/2017    Years since quitting: 1.7  . Smokeless tobacco: Never Used  Substance Use Topics  . Alcohol use: Yes    Comment: occ  . Drug use: No     Allergies   Ibuprofen   Review of Systems Review of Systems  Constitutional: Negative for chills, fatigue  and fever.  HENT: Positive for voice change. Negative for congestion, ear pain, postnasal drip, sinus pressure, sinus pain and sore throat.   Eyes: Negative for pain and visual disturbance.  Respiratory: Positive for cough. Negative for choking, chest tightness and shortness of breath.   Cardiovascular: Negative for chest pain and palpitations.  Gastrointestinal: Negative for abdominal pain, diarrhea, nausea and vomiting.  Genitourinary: Negative for dyspareunia, dysuria, flank pain, frequency, hematuria, menstrual problem, pelvic pain, urgency, vaginal bleeding, vaginal discharge and vaginal pain.  Musculoskeletal: Negative for arthralgias, back pain and myalgias.  Skin: Negative for color change and rash.  Neurological: Negative for seizures, syncope and headaches.  All other systems reviewed and are negative.    Physical Exam Triage Vital Signs ED Triage Vitals  Enc Vitals Group     BP 06/07/19 1606 129/77     Pulse Rate 06/07/19 1606 76     Resp 06/07/19 1606 16     Temp 06/07/19 1606 98.2 F (36.8 C)     Temp Source 06/07/19 1606 Oral     SpO2 06/07/19 1606 99 %     Weight --      Height --      Head Circumference --      Peak Flow --      Pain Score 06/07/19 1604 0     Pain Loc --      Pain Edu? --      Excl. in GC? --    No data found.  Updated Vital Signs BP 129/77 (BP Location: Left Arm)   Pulse 76   Temp 98.2 F (36.8 C) (Oral)   Resp 16   SpO2 99%   Visual Acuity Right Eye Distance:   Left Eye Distance:   Bilateral Distance:    Right Eye Near:   Left Eye Near:    Bilateral Near:     Physical Exam Vitals and nursing note reviewed.  Constitutional:      General: She is not in acute distress.    Appearance: Normal appearance. She is well-developed. She is not ill-appearing.  HENT:     Head: Normocephalic and atraumatic.     Right Ear: Tympanic membrane normal.     Left Ear: Tympanic membrane normal.     Nose: No congestion or rhinorrhea.      Mouth/Throat:     Mouth: Mucous membranes are moist.  Pharynx: Oropharynx is clear.  Eyes:     General: No scleral icterus.    Extraocular Movements: Extraocular movements intact.     Conjunctiva/sclera: Conjunctivae normal.     Pupils: Pupils are equal, round, and reactive to light.  Cardiovascular:     Rate and Rhythm: Normal rate and regular rhythm.     Heart sounds: No murmur.  Pulmonary:     Effort: Pulmonary effort is normal. No respiratory distress.     Breath sounds: Normal breath sounds. No wheezing, rhonchi or rales.  Abdominal:     Palpations: Abdomen is soft.     Tenderness: There is no abdominal tenderness.  Musculoskeletal:        General: Normal range of motion.     Cervical back: Neck supple.  Skin:    General: Skin is warm and dry.     Comments: Acneform lesions along upper chest and along neck. Mixture of inflammatory and pustular.  Neurological:     General: No focal deficit present.     Mental Status: She is alert and oriented to person, place, and time.  Psychiatric:        Mood and Affect: Mood normal.        Behavior: Behavior normal.        Thought Content: Thought content normal.        Judgment: Judgment normal.      UC Treatments / Results  Labs (all labs ordered are listed, but only abnormal results are displayed) Labs Reviewed  NOVEL CORONAVIRUS, NAA (HOSP ORDER, SEND-OUT TO REF LAB; TAT 18-24 HRS)  HIV ANTIBODY (ROUTINE TESTING W REFLEX)  RPR  CERVICOVAGINAL ANCILLARY ONLY    EKG   Radiology No results found.  Procedures Procedures (including critical care time)  Medications Ordered in UC Medications - No data to display  Initial Impression / Assessment and Plan / UC Course  I have reviewed the triage vital signs and the nursing notes.  Pertinent labs & imaging results that were available during my care of the patient were reviewed by me and considered in my medical decision making (see chart for details).      #Laryngitis #Cough Likely viral etiology, COVID sent. No other symptoms currently. Given inability for complete voice rest at work, will treat with prednisone taper. - tessalon for cough - pred taper  #Acne Appears inflammatory. Instructed to utilize over the counter topical benzoil peroxide and follow up with PCP to discuss other treatment options  #STD screen Sounds more likely BV, Swab sent, will wait for results to treat.   Final Clinical Impressions(s) / UC Diagnoses   Final diagnoses:  Laryngitis  Encounter for laboratory testing for COVID-19 virus  Screening for STD (sexually transmitted disease)  Acne, unspecified acne type  Cough     Discharge Instructions     Take the prednisone as prescribed  Take the tessalon every 8 hours for cough  Rest your voice as much as possible. Hydrate well  Apply over the counter benzoil peroxide to your areas of acne and follow up with your Primary care if worsening or not improving.  We have sent your lab work out and will notify you of any positive results.   If your Covid-19 test is positive, you will receive a phone call from [facility] regarding your results. Negative test results are not called. Both positive and negative results area always visible on MyChart. If you do not have a MyChart account, sign up instructions are in your  discharge papers.   Persons who are directed to care for themselves at home may discontinue isolation under the following conditions:  . At least 10 days have passed since symptom onset and . At least 24 hours have passed without running a fever (this means without the use of fever-reducing medications) and . Other symptoms have improved.  Persons infected with COVID-19 who never develop symptoms may discontinue isolation and other precautions 10 days after the date of their first positive COVID-19 test.     ED Prescriptions    Medication Sig Dispense Auth. Provider   predniSONE  (STERAPRED UNI-PAK 21 TAB) 10 MG (21) TBPK tablet Take 6 tablets (60 mg total) by mouth daily for 1 day, THEN 5 tablets (50 mg total) daily for 1 day, THEN 4 tablets (40 mg total) daily for 1 day, THEN 3 tablets (30 mg total) daily for 1 day, THEN 2 tablets (20 mg total) daily for 1 day, THEN 1 tablet (10 mg total) daily for 1 day. 21 tablet Shigeo Baugh, Veryl Speak, PA-C   benzonatate (TESSALON) 100 MG capsule Take 1 capsule (100 mg total) by mouth every 8 (eight) hours. 21 capsule Mattilyn Crites, Veryl Speak, PA-C     PDMP not reviewed this encounter.   Hermelinda Medicus, PA-C 06/08/19 1011

## 2019-06-07 NOTE — ED Triage Notes (Signed)
Patient presents to Urgent Care with complaints of dry cough and loss of voice since the past few days. Patient reports she would also like std testing, has been experiencing a fishy odor w/ sexual intercourse. Pt has been douching.

## 2019-06-07 NOTE — Discharge Instructions (Signed)
Take the prednisone as prescribed  Take the tessalon every 8 hours for cough  Rest your voice as much as possible. Hydrate well  Apply over the counter benzoil peroxide to your areas of acne and follow up with your Primary care if worsening or not improving.  We have sent your lab work out and will notify you of any positive results.   If your Covid-19 test is positive, you will receive a phone call from [facility] regarding your results. Negative test results are not called. Both positive and negative results area always visible on MyChart. If you do not have a MyChart account, sign up instructions are in your discharge papers.   Persons who are directed to care for themselves at home may discontinue isolation under the following conditions:   At least 10 days have passed since symptom onset and  At least 24 hours have passed without running a fever (this means without the use of fever-reducing medications) and  Other symptoms have improved.  Persons infected with COVID-19 who never develop symptoms may discontinue isolation and other precautions 10 days after the date of their first positive COVID-19 test.

## 2019-06-08 LAB — NOVEL CORONAVIRUS, NAA (HOSP ORDER, SEND-OUT TO REF LAB; TAT 18-24 HRS): SARS-CoV-2, NAA: DETECTED — AB

## 2019-06-08 LAB — RPR: RPR Ser Ql: NONREACTIVE

## 2019-06-09 ENCOUNTER — Telehealth (HOSPITAL_COMMUNITY): Payer: Self-pay | Admitting: Emergency Medicine

## 2019-06-09 NOTE — Telephone Encounter (Signed)

## 2019-06-10 LAB — CERVICOVAGINAL ANCILLARY ONLY
Bacterial vaginitis: POSITIVE — AB
Candida vaginitis: POSITIVE — AB
Chlamydia: NEGATIVE
Neisseria Gonorrhea: NEGATIVE
Trichomonas: NEGATIVE

## 2019-06-11 ENCOUNTER — Telehealth (HOSPITAL_COMMUNITY): Payer: Self-pay | Admitting: Emergency Medicine

## 2019-06-11 ENCOUNTER — Encounter (HOSPITAL_COMMUNITY): Payer: Self-pay

## 2019-06-11 MED ORDER — METRONIDAZOLE 500 MG PO TABS: 500.0000 mg | ORAL_TABLET | Freq: Two times a day (BID) | ORAL | 0 refills | Status: AC

## 2019-06-11 MED ORDER — FLUCONAZOLE 150 MG PO TABS: 150.0000 mg | ORAL_TABLET | Freq: Once | ORAL | 0 refills | Status: AC

## 2019-06-11 NOTE — Telephone Encounter (Signed)
Bacterial vaginosis is positive. Pt needs treatment. Flagyl 500 mg BID x 7 days #14 no refills sent to patients pharmacy of choice.    Test for candida (yeast) was positive.  Prescription for fluconazole 150mg  po now, repeat dose in 3d if needed, #2 no refills, sent to the pharmacy of record.  Recheck or followup with PCP for further evaluation if symptoms are not improving.    Attempted to reach patient. No answer at this time. Voicemail left.   If you have any questions, you may call me at [phone]

## 2019-06-16 ENCOUNTER — Telehealth (HOSPITAL_COMMUNITY): Payer: Self-pay | Admitting: Emergency Medicine

## 2019-06-16 NOTE — Telephone Encounter (Signed)
Attempted to reach patient x2. No answer at this time. Voicemail left.    

## 2019-06-17 ENCOUNTER — Ambulatory Visit: Payer: Medicaid Other | Admitting: Obstetrics and Gynecology

## 2019-09-15 ENCOUNTER — Ambulatory Visit (HOSPITAL_COMMUNITY)
Admission: EM | Admit: 2019-09-15 | Discharge: 2019-09-15 | Disposition: A | Discharge status: Home / Self Care | Payer: Medicaid Other | Attending: Family Medicine | Admitting: Family Medicine

## 2019-09-15 ENCOUNTER — Other Ambulatory Visit: Payer: Self-pay

## 2019-09-15 DIAGNOSIS — R112 Nausea with vomiting, unspecified: Secondary | ICD-10-CM | POA: Insufficient documentation

## 2019-09-15 DIAGNOSIS — Z113 Encounter for screening for infections with a predominantly sexual mode of transmission: Secondary | ICD-10-CM | POA: Insufficient documentation

## 2019-09-15 DIAGNOSIS — Z3202 Encounter for pregnancy test, result negative: Secondary | ICD-10-CM

## 2019-09-15 DIAGNOSIS — R197 Diarrhea, unspecified: Secondary | ICD-10-CM | POA: Insufficient documentation

## 2019-09-15 DIAGNOSIS — R109 Unspecified abdominal pain: Secondary | ICD-10-CM

## 2019-09-15 DIAGNOSIS — R110 Nausea: Secondary | ICD-10-CM

## 2019-09-15 LAB — POCT URINALYSIS DIP (DEVICE)
Bilirubin Urine: NEGATIVE
Glucose, UA: NEGATIVE mg/dL
Hgb urine dipstick: NEGATIVE
Ketones, ur: NEGATIVE mg/dL
Leukocytes,Ua: NEGATIVE
Nitrite: NEGATIVE
Protein, ur: 30 mg/dL — AB
Specific Gravity, Urine: 1.02 (ref 1.005–1.030)
Urobilinogen, UA: 0.2 mg/dL (ref 0.0–1.0)
pH: 8.5 — ABNORMAL HIGH (ref 5.0–8.0)

## 2019-09-15 LAB — POC URINE PREG, ED: Preg Test, Ur: NEGATIVE

## 2019-09-15 LAB — HIV ANTIBODY (ROUTINE TESTING W REFLEX): HIV Screen 4th Generation wRfx: NONREACTIVE

## 2019-09-15 MED ORDER — ONDANSETRON 4 MG PO TBDP: 4.0000 mg | ORAL_TABLET | Freq: Three times a day (TID) | ORAL | 0 refills | Status: DC | PRN

## 2019-09-15 NOTE — ED Triage Notes (Addendum)
Pt c/o N/V/D x 2 days. Pt states she was vomitting in the lobby. Has been drinking gatorade and has not been able to tolerate any solid food. Would like an STD check, Denies any discharge. Had a stillborn in February 2020 and is concerned.

## 2019-09-15 NOTE — Discharge Instructions (Signed)
Your nausea, vomiting, and diarrhea most likely a stomach bug that should improve with a few more days.  STD testing pending, we will call with results if abnormal.  For nausea: Zofran prescribed. Begin with every 6 hours, than as you are able to hold food down, take it as needed. Start with clear liquids, then move to plain foods like bananas, rice, applesauce, toast, broth, grits, oatmeal. As those food settle okay you may transition to your normal foods. Avoid spicy and greasy foods as much as possible.  For Diarrhea: This is your body's natural way of getting rid of a virus. You may try taking 1 imodium to decrease amount of stools a day, but we do not want you to stop your diarrhea.   Preventing dehydration is key! You need to replace the fluid your body is expelling. Drink plenty of fluids, may use Pedialyte or sports drinks.   Please return if you are experiencing blood in your vomit or stool or experiencing dizziness, lightheadedness, extreme fatigue, increased abdominal pain.

## 2019-09-15 NOTE — ED Provider Notes (Signed)
[facility]    CSN: [id number] Arrival date & time: 09/15/19  1014      History   Chief Complaint Chief Complaint  Patient presents with  . Abdominal Pain  . Nausea    HPI Barbara Ramos is a 29 y.o. female history of migraines presenting today for evaluation of nausea vomiting diarrhea and STD screening.  Patient reports that over the past 2 days she has had nausea vomiting and diarrhea.  She has had poor oral intake and difficulty tolerating liquids and solids.  She has had some generalized uneasiness in her stomach, but denies any significant abdominal pain.  She reports associated fatigue, lightheadedness and generalized weakness.  She would also like to be screened for STDs.  She denies any specific known exposures or any specific concerning symptoms.  She is currently towards the end of her menstrual cycle and reports that this was earlier than normal.  She is not on any form of birth control.  Reports urinary frequency, but denies dysuria, urgency or hematuria.  Denies discharge.  Denies URI symptoms.  Denies fevers.  HPI  Past Medical History:  Diagnosis Date  . Allergy   . Anxiety   . Migraines   . Sickle cell trait [facility])     Patient Active Problem List   Diagnosis Date Noted  . Biological false positive RPR test 04/10/2019  . 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 SVD (spontaneous vaginal delivery) 06/18/2018  . IUFD at 20 weeks or more of gestation 06/18/2018  . Abruptio placenta, third trimester 06/17/2018    Past Surgical History:  Procedure Laterality Date  . FOOT SURGERY    . WISDOM TOOTH EXTRACTION      OB History    Gravida  5   Para  3   Term  2   Preterm  1   AB  2   Living  2     SAB      TAB      Ectopic      Multiple  0   Live Births  2            Home Medications    Prior to Admission medications   Medication Sig Start Date End Date Taking? Authorizing Provider  ondansetron (ZOFRAN ODT) 4 MG disintegrating tablet  Take 1 tablet (4 mg total) by mouth every 8 (eight) hours as needed for nausea or vomiting. 09/15/19   Manvi Guilliams C, PA-C  ferrous sulfate 325 (65 FE) MG tablet Take 1 tablet (325 mg total) by mouth daily with breakfast. Patient not taking: Reported on 04/04/2019 06/19/18 09/15/19  Adam Phenix, MD    Family History Family History  Problem Relation Age of Onset  . Healthy Mother   . Healthy Father     Social History Social History   Tobacco Use  . Smoking status: Former Smoker    Packs/day: 0.01    Types: Cigarettes    Quit date: 09/23/2017    Years since quitting: 1.9  . Smokeless tobacco: Never Used  Substance Use Topics  . Alcohol use: Yes    Comment: occ  . Drug use: No     Allergies   Ibuprofen   Review of Systems Review of Systems  Constitutional: Positive for fatigue. Negative for activity change, appetite change, chills and fever.  HENT: Negative for congestion, ear pain, rhinorrhea, sinus pressure, sore throat and trouble swallowing.   Eyes: Negative for discharge and redness.  Respiratory: Negative for cough,  chest tightness and shortness of breath.   Cardiovascular: Negative for chest pain.  Gastrointestinal: Positive for diarrhea, nausea and vomiting. Negative for abdominal pain.  Musculoskeletal: Negative for myalgias.  Skin: Negative for rash.  Neurological: Positive for light-headedness and headaches. Negative for dizziness.     Physical Exam Triage Vital Signs ED Triage Vitals  Enc Vitals Group     BP      Pulse      Resp      Temp      Temp src      SpO2      Weight      Height      Head Circumference      Peak Flow      Pain Score      Pain Loc      Pain Edu?      Excl. in GC?    No data found.  Updated Vital Signs BP 120/87 (BP Location: Left Arm)   Pulse 77   Temp 98.1 F (36.7 C) (Oral)   Resp 16   LMP 09/08/2019 (Approximate)   SpO2 100%   Visual Acuity Right Eye Distance:   Left Eye Distance:   Bilateral  Distance:    Right Eye Near:   Left Eye Near:    Bilateral Near:     Physical Exam Vitals and nursing note reviewed.  Constitutional:      Appearance: She is well-developed.     Comments: No acute distress  HENT:     Head: Normocephalic and atraumatic.     Nose: Nose normal.     Mouth/Throat:     Comments: Oral mucosa pink and moist, no tonsillar enlargement or exudate. Posterior pharynx patent and nonerythematous, no uvula deviation or swelling. Normal phonation. Eyes:     Extraocular Movements: Extraocular movements intact.     Conjunctiva/sclera: Conjunctivae normal.     Pupils: Pupils are equal, round, and reactive to light.  Cardiovascular:     Rate and Rhythm: Normal rate.  Pulmonary:     Effort: Pulmonary effort is normal. No respiratory distress.     Comments: Breathing comfortably at rest, CTABL, no wheezing, rales or other adventitious sounds auscultated Abdominal:     General: There is no distension.     Comments: Soft, nondistended, nontender to light and deep palpation throughout entire abdomen  Musculoskeletal:        General: Normal range of motion.     Cervical back: Neck supple.  Skin:    General: Skin is warm and dry.  Neurological:     Mental Status: She is alert and oriented to person, place, and time.      UC Treatments / Results  Labs (all labs ordered are listed, but only abnormal results are displayed) Labs Reviewed  POCT URINALYSIS DIP (DEVICE) - Abnormal; Notable for the following components:      Result Value   pH 8.5 (*)    Protein, ur 30 (*)    All other components within normal limits  HIV ANTIBODY (ROUTINE TESTING W REFLEX)  RPR  POC URINE PREG, ED  CERVICOVAGINAL ANCILLARY ONLY    EKG   Radiology No results found.  Procedures Procedures (including critical care time)  Medications Ordered in UC Medications - No data to display  Initial Impression / Assessment and Plan / UC Course  I have reviewed the triage vital signs  and the nursing notes.  Pertinent labs & imaging results that were available during my care  of the patient were reviewed by me and considered in my medical decision making (see chart for details).     Pregnancy test negative, UA with negative leuks and nitrites, vaginal swab pending to screen for STDs, including blood work for HIV and syphilis.  At this time suspect nausea vomiting diarrhea likely gastroenteritis and viral etiology.  Recommend symptomatic and supportive care.  Negative peritoneal signs, do not suspect abdominal emergency. Discussed strict return precautions. Patient verbalized understanding and is agreeable with plan.   Final Clinical Impressions(s) / UC Diagnoses   Final diagnoses:  Nausea vomiting and diarrhea  Screen for STD (sexually transmitted disease)     Discharge Instructions     Your nausea, vomiting, and diarrhea most likely a stomach bug that should improve with a few more days.  STD testing pending, we will call with results if abnormal.  For nausea: Zofran prescribed. Begin with every 6 hours, than as you are able to hold food down, take it as needed. Start with clear liquids, then move to plain foods like bananas, rice, applesauce, toast, broth, grits, oatmeal. As those food settle okay you may transition to your normal foods. Avoid spicy and greasy foods as much as possible.  For Diarrhea: This is your body's natural way of getting rid of a virus. You may try taking 1 imodium to decrease amount of stools a day, but we do not want you to stop your diarrhea.   Preventing dehydration is key! You need to replace the fluid your body is expelling. Drink plenty of fluids, may use Pedialyte or sports drinks.   Please return if you are experiencing blood in your vomit or stool or experiencing dizziness, lightheadedness, extreme fatigue, increased abdominal pain.     ED Prescriptions    Medication Sig Dispense Auth. Provider   ondansetron (ZOFRAN ODT) 4 MG  disintegrating tablet Take 1 tablet (4 mg total) by mouth every 8 (eight) hours as needed for nausea or vomiting. 20 tablet Meleny Tregoning, Elbert C, PA-C     PDMP not reviewed this encounter.   Nori Poland, [place] C, PA-C 09/15/19 1500

## 2019-09-16 ENCOUNTER — Telehealth (HOSPITAL_COMMUNITY): Payer: Self-pay

## 2019-09-16 LAB — CERVICOVAGINAL ANCILLARY ONLY
Bacterial Vaginitis (gardnerella): POSITIVE — AB
Candida Glabrata: NEGATIVE
Candida Vaginitis: POSITIVE — AB
Chlamydia: NEGATIVE
Comment: NEGATIVE
Comment: NEGATIVE
Comment: NEGATIVE
Comment: NEGATIVE
Comment: NEGATIVE
Comment: NORMAL
Neisseria Gonorrhea: NEGATIVE
Trichomonas: POSITIVE — AB

## 2019-09-16 LAB — RPR: RPR Ser Ql: NONREACTIVE

## 2019-09-16 MED ORDER — METRONIDAZOLE 500 MG PO TABS: 500.0000 mg | ORAL_TABLET | Freq: Two times a day (BID) | ORAL | 0 refills | Status: DC

## 2019-09-16 MED ORDER — FLUCONAZOLE 150 MG PO TABS: 150.0000 mg | ORAL_TABLET | Freq: Every day | ORAL | 0 refills | Status: AC

## 2020-01-12 IMAGING — US US MFM OB LIMITED
1 series · 15 of 15 positions shown · non-contrast
Comparison: none

[Series 1: us mfm ob limited · 15 acquisitions, 15 frames shown]
[im 1/15]
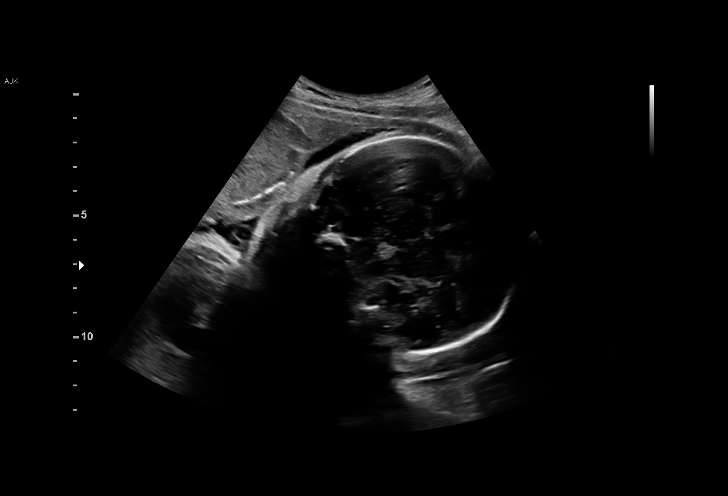
[im 2/15]
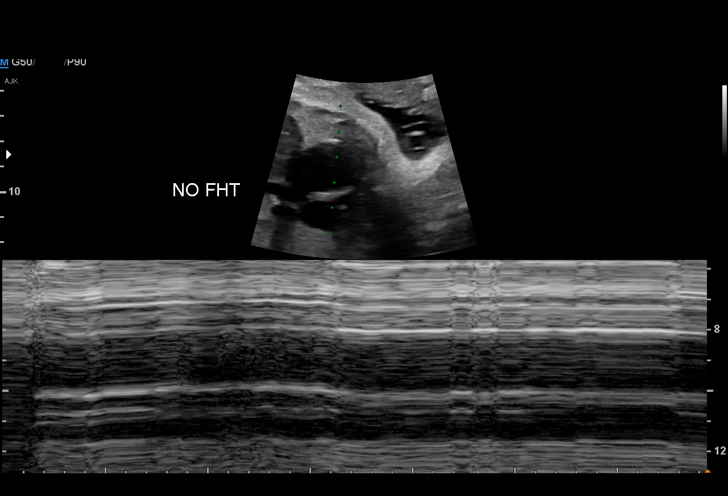
[im 3/15]
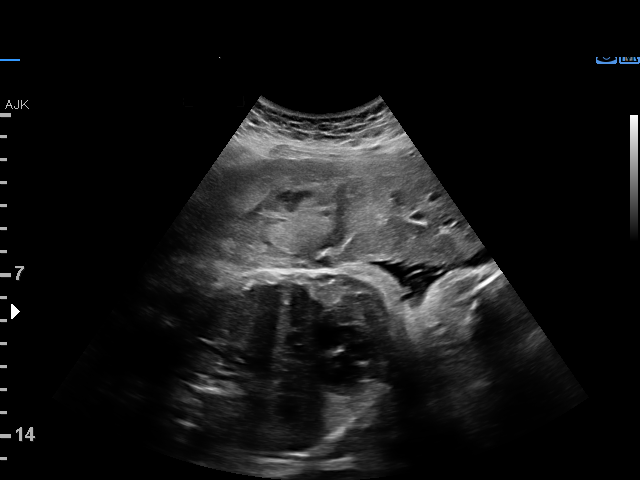
[im 4/15]
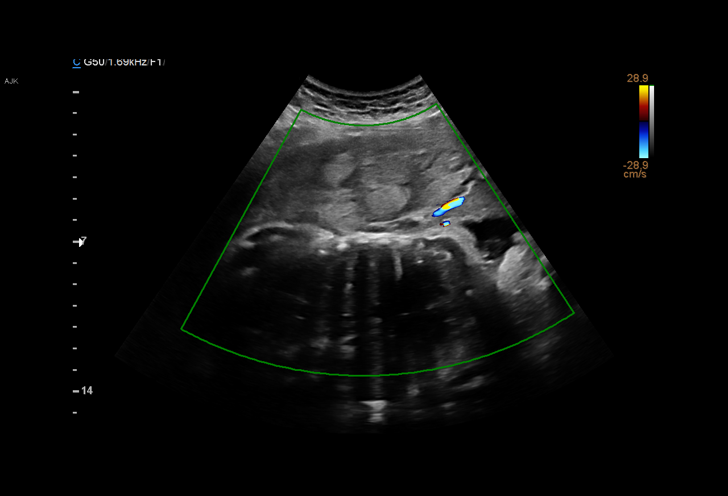
[im 5/15]
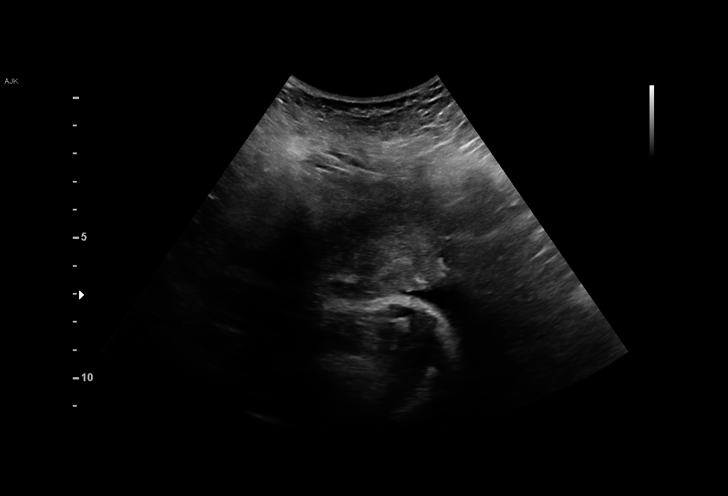
[im 6/15]
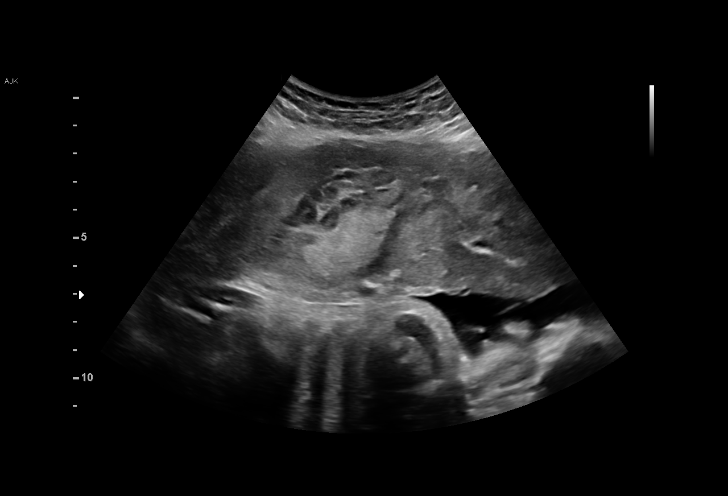
[im 7/15]
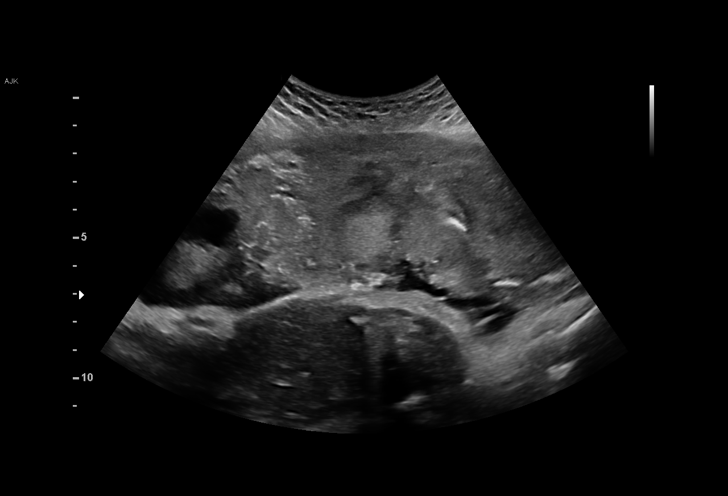
[im 8/15]
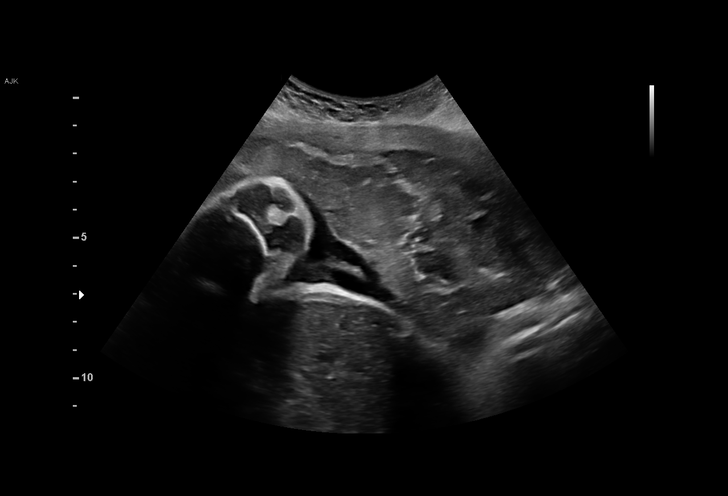
[im 9/15]
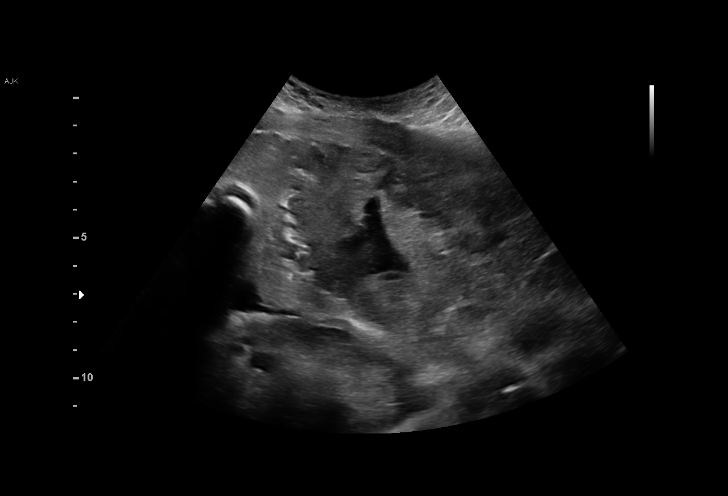
[im 10/15]
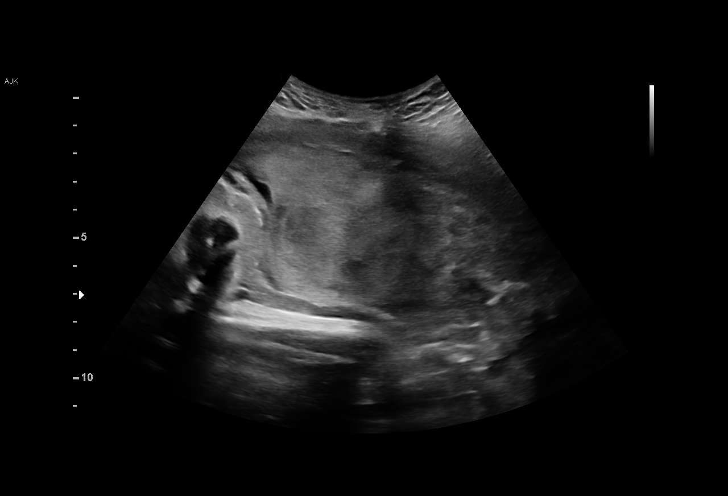
[im 11/15]
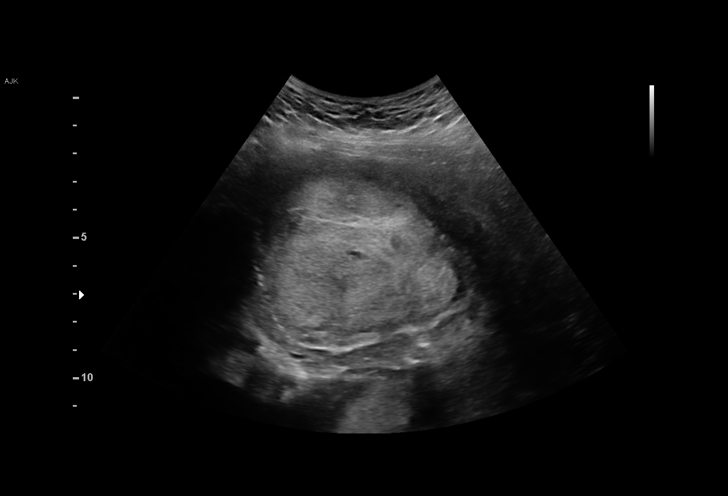
[im 12/15]
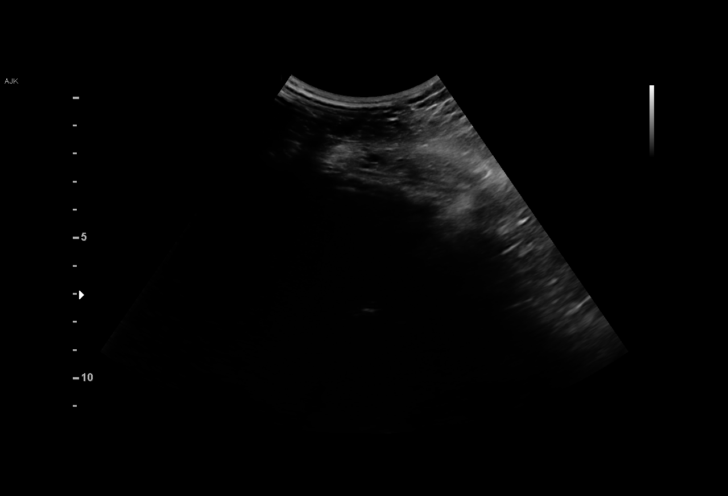
[im 13/15]
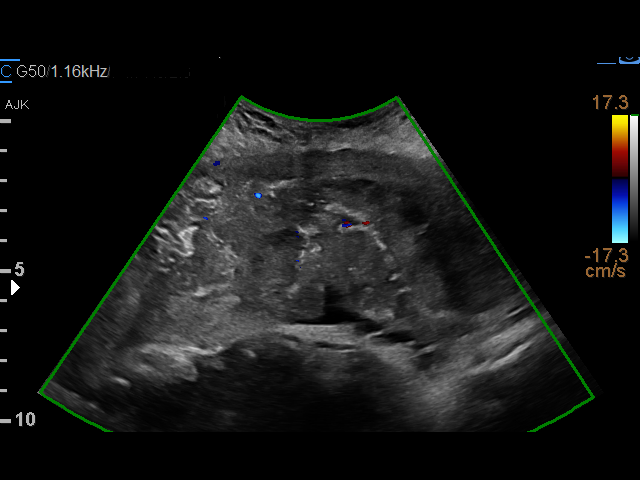
[im 14/15]
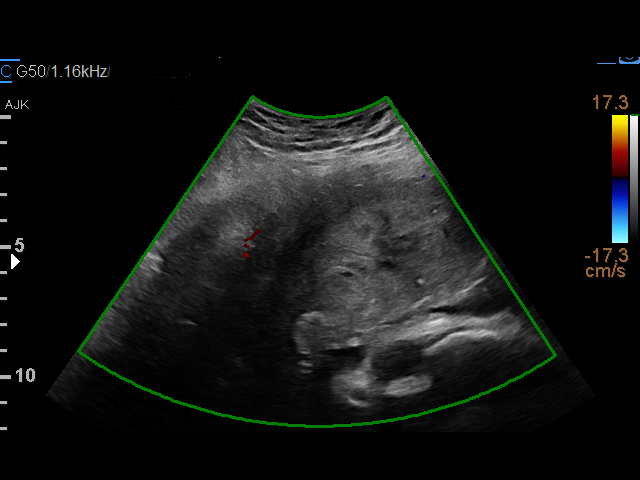
[im 15/15]
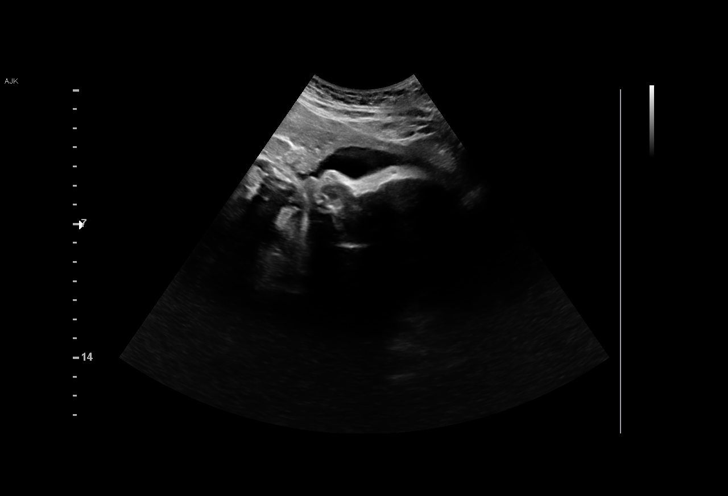

[15 of 15 positions shown; findings below may reference images not displayed]

AUJLA CNM

  1  US MFM OB LIMITED                    76815.01     TORITO NAHAR
 ----------------------------------------------------------------------

 ----------------------------------------------------------------------
Indications

  34 weeks gestation of pregnancy
  Pelvic pain affecting pregnancy in third
  trimester
 ----------------------------------------------------------------------
Vital Signs

                                                Height:        5'6"
Fetal Evaluation

 Num Of Fetuses:          1
 Cardiac Activity:        Absent
 Presentation:            Cephalic
 Placenta:                Anterior

 Amniotic Fluid
 AFI FV:      Within normal limits

 Comment:    Suspected anterior abruption noted.
OB History

 Gravidity:    3         Term:   2
 Living:       2
Gestational Age

 LMP:           34w 6d        Date:  10/16/17                 EDD:   07/23/18
 Best:          34w 6d     Det. By:  LMP  (10/16/17)          EDD:   07/23/18
Comments

 Provider gave patient results in room.
Impression

 Patient was evaluated for abdominal pain.
 A limited ultrasound study was performed. Unfortunately, fetal
 cardiac activity was absent. Amniotic fluid is normal.
 Placenta is anterior and there is evidence of placental
 abruption (cannot be quantified).

                 Tiger, Lkw

## 2020-01-15 ENCOUNTER — Ambulatory Visit (HOSPITAL_COMMUNITY)
Admission: EM | Admit: 2020-01-15 | Discharge: 2020-01-15 | Disposition: A | Discharge status: Home / Self Care | Payer: Self-pay | Attending: Family Medicine | Admitting: Family Medicine

## 2020-01-15 ENCOUNTER — Encounter (HOSPITAL_COMMUNITY): Payer: Self-pay

## 2020-01-15 ENCOUNTER — Other Ambulatory Visit: Payer: Self-pay

## 2020-01-15 DIAGNOSIS — Z113 Encounter for screening for infections with a predominantly sexual mode of transmission: Secondary | ICD-10-CM | POA: Insufficient documentation

## 2020-01-15 DIAGNOSIS — Z3202 Encounter for pregnancy test, result negative: Secondary | ICD-10-CM

## 2020-01-15 LAB — POCT URINALYSIS DIPSTICK, ED / UC
Bilirubin Urine: NEGATIVE
Glucose, UA: NEGATIVE mg/dL
Hgb urine dipstick: NEGATIVE
Leukocytes,Ua: NEGATIVE
Nitrite: NEGATIVE
Protein, ur: NEGATIVE mg/dL
Specific Gravity, Urine: 1.025 (ref 1.005–1.030)
Urobilinogen, UA: 0.2 mg/dL (ref 0.0–1.0)
pH: 7 (ref 5.0–8.0)

## 2020-01-15 LAB — POC URINE PREG, ED: Preg Test, Ur: NEGATIVE

## 2020-01-15 NOTE — ED Provider Notes (Addendum)
[facility]    CSN: [id number] Arrival date & time: 01/15/20  1340      History   Chief Complaint Chief Complaint  Patient presents with  . SEXUALLY TRANSMITTED DISEASE    HPI Barbara Ramos is a 29 y.o. female.   HPI Patient presents today for STI testing.  No known exposure or concerning symptoms. She is only present for routine STD check. Patient's last menstrual period was 12/11/2019 (approximate).  Past Medical History:  Diagnosis Date  . Allergy   . Anxiety   . Migraines   . Sickle cell trait [facility])     Patient Active Problem List   Diagnosis Date Noted  . Biological false positive RPR test 04/10/2019  . 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 SVD (spontaneous vaginal delivery) 06/18/2018  . IUFD at 20 weeks or more of gestation 06/18/2018  . Abruptio placenta, third trimester 06/17/2018    Past Surgical History:  Procedure Laterality Date  . FOOT SURGERY    . WISDOM TOOTH EXTRACTION      OB History    Gravida  5   Para  3   Term  2   Preterm  1   AB  2   Living  2     SAB      TAB      Ectopic      Multiple  0   Live Births  2            Home Medications    Prior to Admission medications   Medication Sig Start Date End Date Taking? Authorizing Provider  metroNIDAZOLE (FLAGYL) 500 MG tablet Take 1 tablet (500 mg total) by mouth 2 (two) times daily. 09/16/19   Merrilee Jansky, MD  ondansetron (ZOFRAN ODT) 4 MG disintegrating tablet Take 1 tablet (4 mg total) by mouth every 8 (eight) hours as needed for nausea or vomiting. 09/15/19   Wieters, Hallie C, PA-C  ferrous sulfate 325 (65 FE) MG tablet Take 1 tablet (325 mg total) by mouth daily with breakfast. Patient not taking: Reported on 04/04/2019 06/19/18 09/15/19  Adam Phenix, MD    Family History Family History  Problem Relation Age of Onset  . Healthy Mother   . Healthy Father     Social History Social History   Tobacco Use  . Smoking status: Former  Smoker    Packs/day: 0.01    Types: Cigarettes    Quit date: 09/23/2017    Years since quitting: 2.3  . Smokeless tobacco: Never Used  Vaping Use  . Vaping Use: Never used  Substance Use Topics  . Alcohol use: Yes    Comment: occ  . Drug use: No    Allergies   Ibuprofen  Review of Systems Review of Systems Pertinent negatives listed in HPI Physical Exam Triage Vital Signs ED Triage Vitals  Enc Vitals Group     BP 01/15/20 1632 113/72     Pulse Rate 01/15/20 1632 63     Resp 01/15/20 1632 18     Temp 01/15/20 1632 98.1 F (36.7 C)     Temp Source 01/15/20 1632 Oral     SpO2 01/15/20 1632 99 %     Weight --      Height --      Head Circumference --      Peak Flow --      Pain Score 01/15/20 1633 0     Pain Loc --  Pain Edu? --      Excl. in GC? --    No data found.  Updated Vital Signs BP 113/72 (BP Location: Left Arm)   Pulse 63   Temp 98.1 F (36.7 C) (Oral)   Resp 18   LMP 12/11/2019 (Approximate)   SpO2 99%   Visual Acuity Right Eye Distance:   Left Eye Distance:   Bilateral Distance:    Right Eye Near:   Left Eye Near:    Bilateral Near:     Physical Exam General appearance: alert, well developed, well nourished, cooperative and in no distress Head: Normocephalic, without obvious abnormality, atraumatic Respiratory: Respirations even and unlabored, normal respiratory rate Heart: rate and rhythm normal.  Skin: Skin color, texture, turgor normal. No rashes seen  Psych: Appropriate mood and affect. Mental status: Alert, oriented to person, place, and time, thought content appropriate. UC Treatments / Results  Labs (all labs ordered are listed, but only abnormal results are displayed) Labs Reviewed - No data to display  EKG   Radiology No results found.  Procedures Procedures (including critical care time)  Medications Ordered in UC Medications - No data to display  Initial Impression / Assessment and Plan / UC Course  I have  reviewed the triage vital signs and the nursing notes.  Pertinent labs & imaging results that were available during my care of the patient were reviewed by me and considered in my medical decision making (see chart for details).     Vaginal cytology pending.  Urine hCG is negative.  UA unremarkable.  Advised follow up 3 to 5 days for cytology to result.  Any abnormal results will be communicated via MyChart.  Patient verbalized understanding agreement plan. Final Clinical Impressions(s) / UC Diagnoses   Final diagnoses:  Screen for sexually transmitted diseases     Discharge Instructions     You will be notified of your cytology results in approximately 3 to 5 days via MyChart.    ED Prescriptions    None     PDMP not reviewed this encounter.   Bing Neighbors, FNP 01/15/20 1737    Bing Neighbors, FNP 01/15/20 1737

## 2020-01-15 NOTE — Discharge Instructions (Addendum)
You will be notified of your cytology results in approximately 3 to 5 days via MyChart.

## 2020-01-15 NOTE — ED Triage Notes (Signed)
Pt c/o vaginal itching with malodor to vaginal secretions for approx 1 week. States she completed a 3 day course of Monistat with improvement to itching.  Denies known STI exposure, vaginal discharge, unusual vag bleeding, n/v/d, abdominal pain, fever, chills, or dysuria sx.

## 2020-01-21 ENCOUNTER — Telehealth (HOSPITAL_COMMUNITY): Payer: Self-pay | Admitting: Emergency Medicine

## 2020-01-21 LAB — CERVICOVAGINAL ANCILLARY ONLY
Bacterial Vaginitis (gardnerella): POSITIVE — AB
Candida Glabrata: NEGATIVE
Candida Vaginitis: POSITIVE — AB
Chlamydia: NEGATIVE
Comment: NEGATIVE
Comment: NEGATIVE
Comment: NEGATIVE
Comment: NEGATIVE
Comment: NEGATIVE
Comment: NORMAL
Neisseria Gonorrhea: NEGATIVE
Trichomonas: NEGATIVE

## 2020-01-21 MED ORDER — METRONIDAZOLE 500 MG PO TABS: 500.0000 mg | ORAL_TABLET | Freq: Two times a day (BID) | ORAL | 0 refills | Status: DC

## 2020-01-21 MED ORDER — FLUCONAZOLE 150 MG PO TABS: 150.0000 mg | ORAL_TABLET | Freq: Every day | ORAL | 0 refills | Status: DC

## 2020-01-25 IMAGING — US US OB LIMITED
1 series · 14 of 27 positions shown · non-contrast
Comparison: none

CLINICAL DATA: 27-year-old pregnant female with vaginal bleeding
after intercourse. LMP: 10/16/2017 corresponding to an estimated
gestational age of 17 weeks, 5 days.

EXAM:
LIMITED OBSTETRIC ULTRASOUND

[Series 1: us ob limited · 0.25mm/px · 27 acquisitions, 14 frames shown]
[im 1/27]
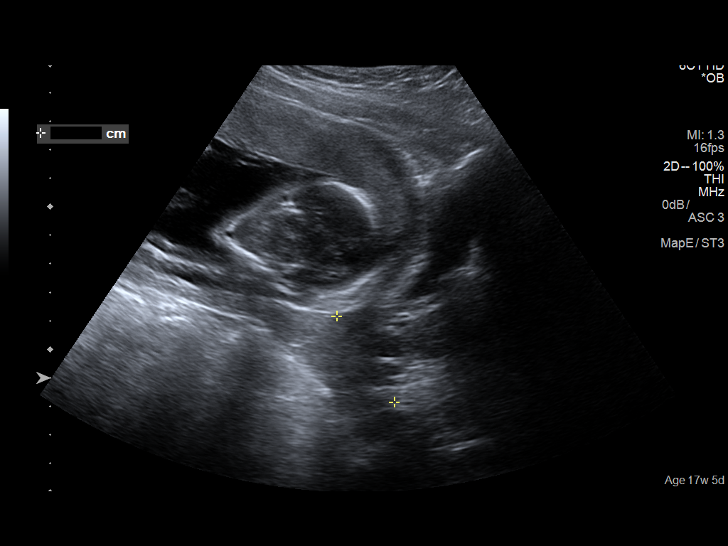
[im 3/27]
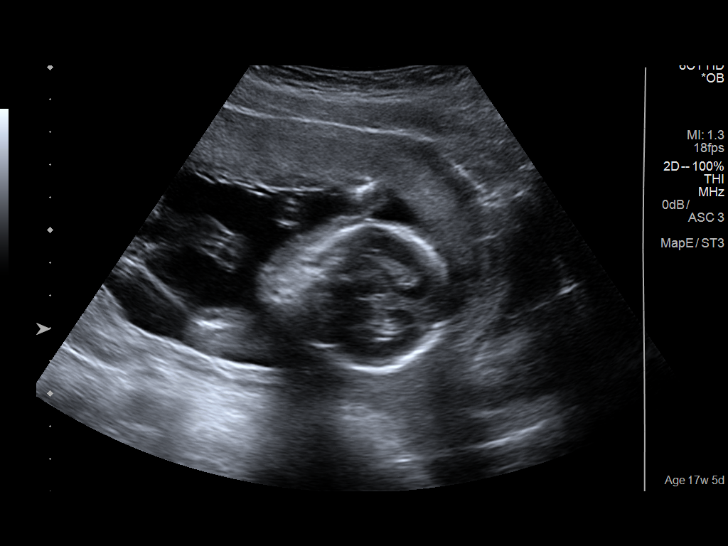
[im 5/27]
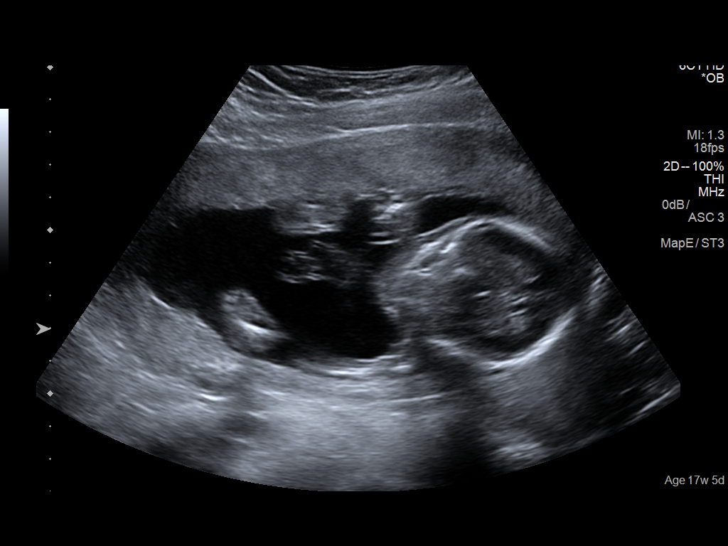
[im 7/27]
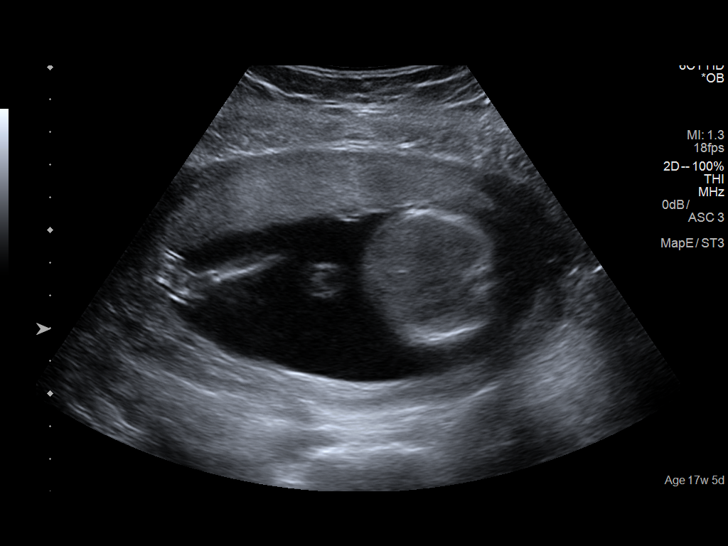
[im 9/27]
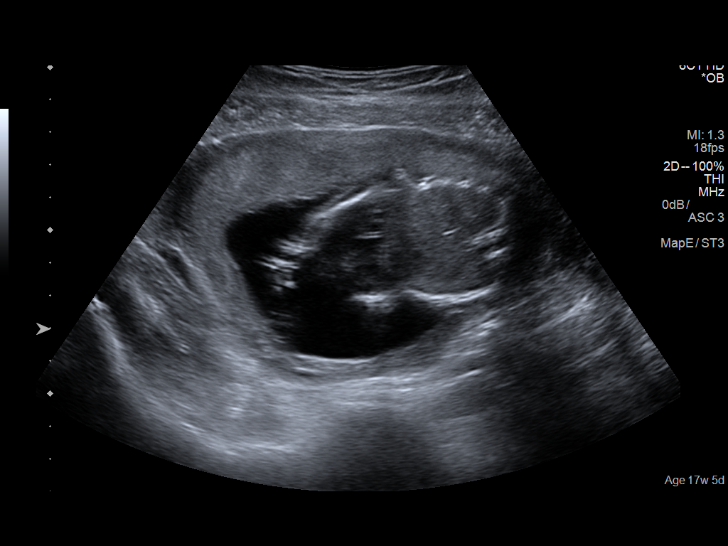
[im 11/27]
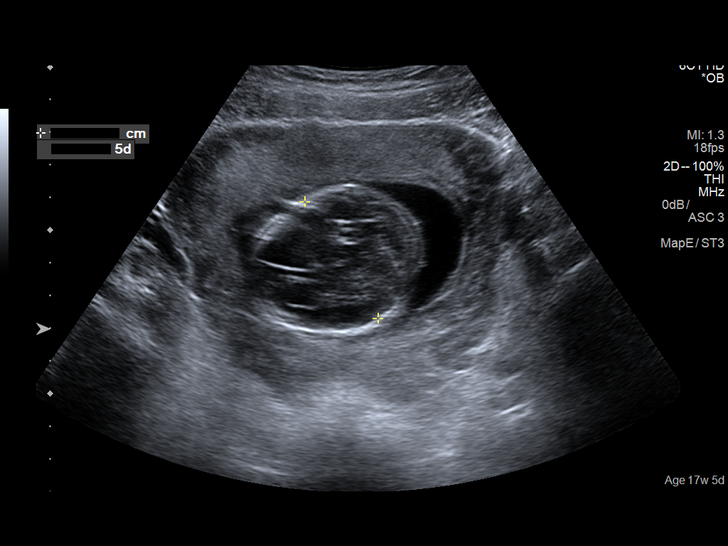
[im 13/27]
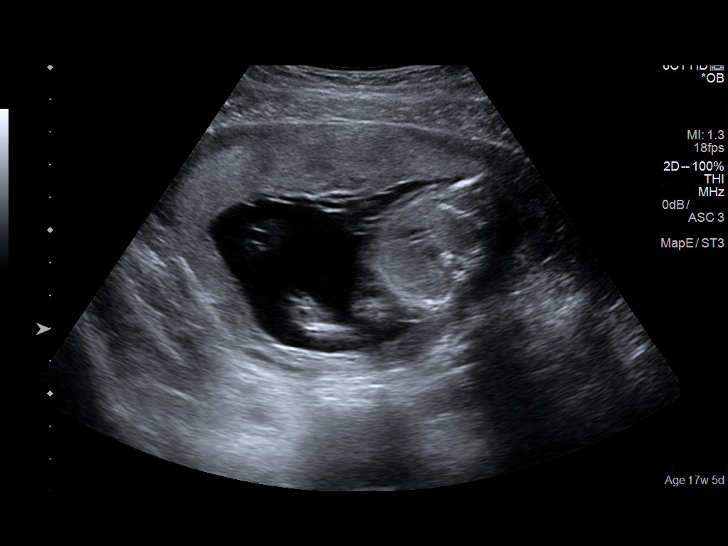
[im 15/27]
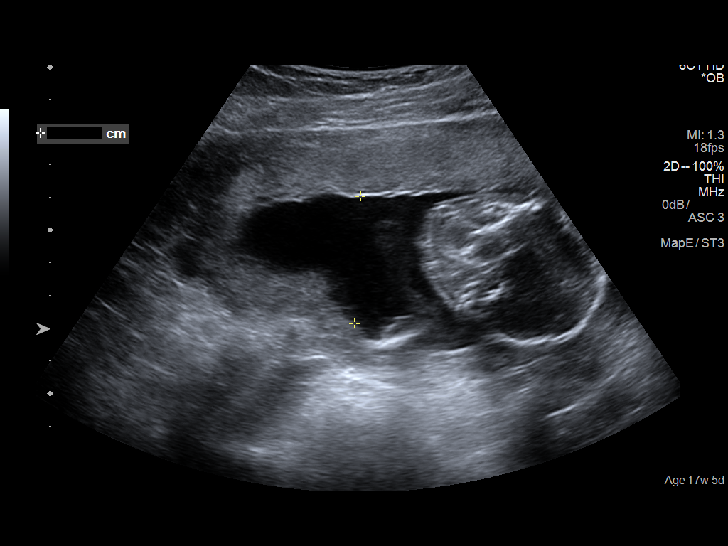
[im 17/27]
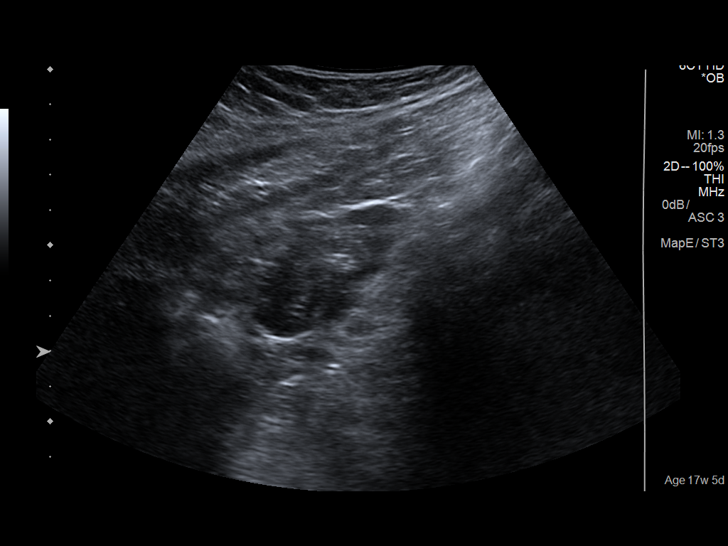
[im 19/27]
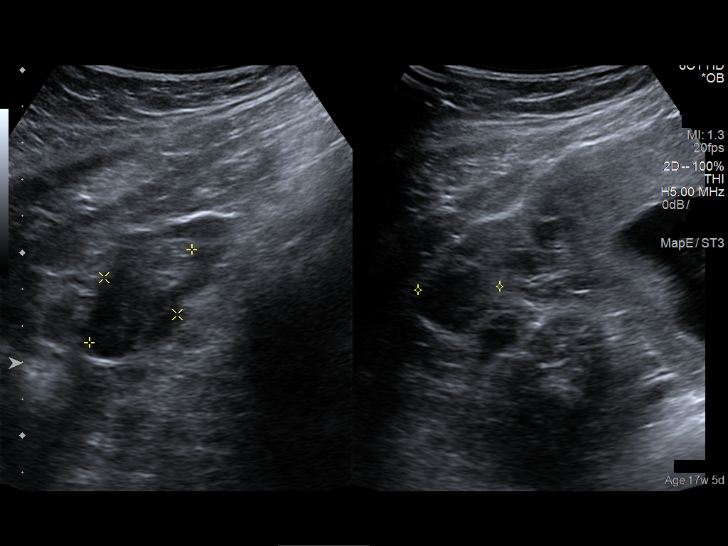
[im 21/27]
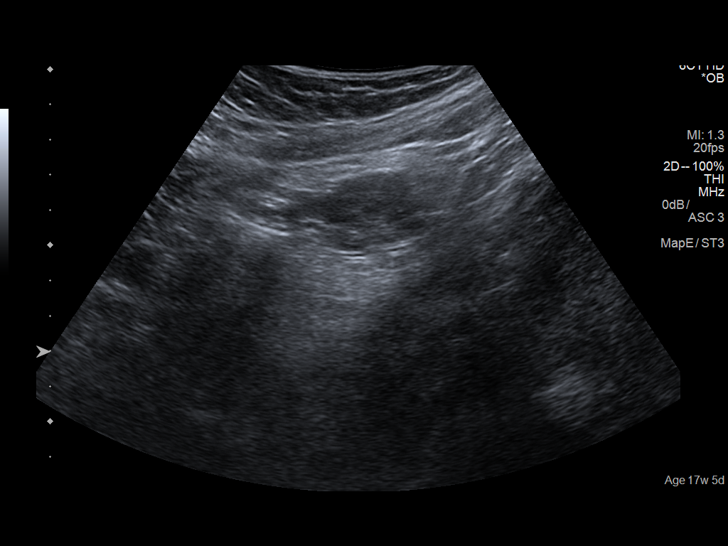
[im 23/27]
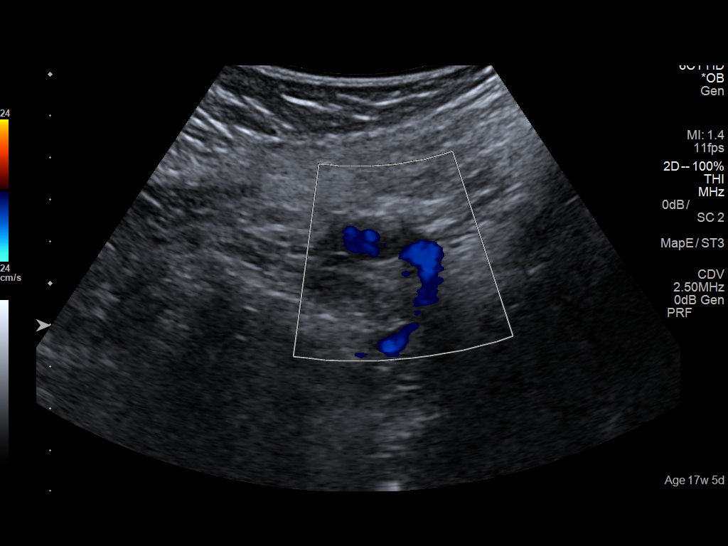
[im 25/27]
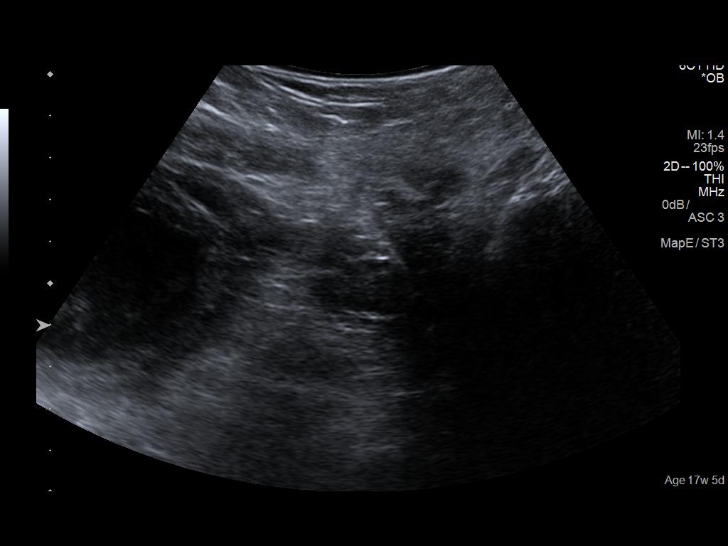
[im 27/27]
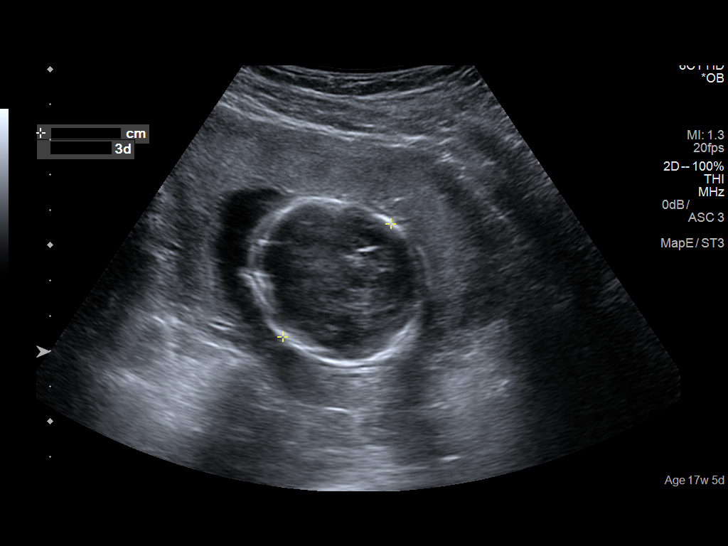

[14 of 27 positions shown; findings below may reference images not displayed]

FINDINGS: Number of Fetuses: 1

Heart Rate:  135 bpm

Movement: Detected

Presentation: Cephalic

Placental Location: Anterior

Previa: No

Amniotic Fluid (Subjective):  Within normal limits.

BPD: 4.3 cm 19 w  0 d

MATERNAL FINDINGS:

Cervix:  Appears closed.

Uterus/Adnexae: No abnormality visualized.
IMPRESSION: Single live intrauterine pregnancy.  No acute findings.

This exam is performed on an emergent basis and does not
comprehensively evaluate fetal size, dating, or anatomy; follow-up
complete OB US should be considered if further fetal assessment is
warranted.

## 2020-03-10 ENCOUNTER — Ambulatory Visit (HOSPITAL_COMMUNITY)
Admission: EM | Admit: 2020-03-10 | Discharge: 2020-03-10 | Disposition: A | Discharge status: Home / Self Care | Payer: Medicaid Other | Attending: Family Medicine | Admitting: Family Medicine

## 2020-03-10 ENCOUNTER — Encounter (HOSPITAL_COMMUNITY): Payer: Self-pay

## 2020-03-10 ENCOUNTER — Other Ambulatory Visit: Payer: Self-pay

## 2020-03-10 DIAGNOSIS — Z202 Contact with and (suspected) exposure to infections with a predominantly sexual mode of transmission: Secondary | ICD-10-CM

## 2020-03-10 MED ORDER — AZITHROMYCIN 250 MG PO TABS
1000.0000 mg | ORAL_TABLET | Freq: Once | ORAL | Status: AC
  Administered 2020-03-10: 1000 mg via ORAL

## 2020-03-10 MED ORDER — AZITHROMYCIN 250 MG PO TABS
ORAL_TABLET | ORAL | Status: AC
  Filled 2020-03-10: qty 4

## 2020-03-10 NOTE — ED Triage Notes (Signed)
Pt presents for STD's testing. Report one of her sexual partner has Chlamydia. Pt reports having lower abdominal pain, white vaginal discharge.

## 2020-03-10 NOTE — ED Provider Notes (Signed)
[facility]   [id number] 03/10/20 Arrival Time: 0931  ASSESSMENT & PLAN:  1. Exposure to STD       Discharge Instructions      You have been given the following today for treatment of possible chlamydia:  Meds ordered this encounter  Medications  . azithromycin (ZITHROMAX) tablet 1,000 mg    Even though we have treated you today, we have sent testing for sexually transmitted infections. We will notify you of any positive results once they are received. If required, we will prescribe any medications you might need.  Please refrain from all sexual activity for at least the next seven days.     Without s/s of PID.  Labs Reviewed  CERVICOVAGINAL ANCILLARY ONLY     Will notify of any positive results. Instructed to refrain from sexual activity for at least seven days.  Reviewed expectations re: course of current medical issues. Questions answered. Outlined signs and symptoms indicating need for more acute intervention. Patient verbalized understanding. After Visit Summary given.   SUBJECTIVE:  Barbara Ramos is a 29 y.o. female who reports sexual partner tested + for chlamydia. She reports no symptoms. Afebrile. No abdominal or pelvic pain. Normal PO intake wihout n/v. No genital rashes or lesions.   OBJECTIVE:  Vitals:   03/10/20 1002  BP: 119/79  Pulse: 63  Resp: 17  Temp: 98.8 F (37.1 C)  TempSrc: Oral  SpO2: 98%     General appearance: alert, cooperative, appears stated age and no distress Lungs: unlabored respirations; speaks full sentences without difficulty Back: no CVA tenderness; FROM at waist Abdomen: soft, non-tender GU: deferred Skin: warm and dry Psychological: alert and cooperative; normal mood and affect.    Labs Reviewed  CERVICOVAGINAL ANCILLARY ONLY    Allergies  Allergen Reactions  . Ibuprofen Other (See Comments)    "triggers headaches"  . Pollen Extract Other (See Comments)    seasonal    Past  Medical History:  Diagnosis Date  . Allergy   . Anxiety   . Migraines   . Sickle cell trait (HCC)    Family History  Problem Relation Age of Onset  . Healthy Mother   . Healthy Father    Social History   Socioeconomic History  . Marital status: Single    Spouse name: Not on file  . Number of children: Not on file  . Years of education: Not on file  . Highest education level: Not on file  Occupational History  . Not on file  Tobacco Use  . Smoking status: Former Smoker    Packs/day: 0.01    Types: Cigarettes    Quit date: 09/23/2017    Years since quitting: 2.4  . Smokeless tobacco: Never Used  Vaping Use  . Vaping Use: Never used  Substance and Sexual Activity  . Alcohol use: Yes    Comment: occ  . Drug use: No  . Sexual activity: Yes    Birth control/protection: None  Other Topics Concern  . Not on file  Social History Narrative  . Not on file   Social Determinants of Health   Financial Resource Strain:   . Difficulty of Paying Living Expenses: Not on file  Food Insecurity:   . Worried About Programme researcher, broadcasting/film/video in the Last Year: Not on file  . Ran Out of Food in the Last Year: Not on file  Transportation Needs:   . Lack of Transportation (Medical): Not on file  . Lack of Transportation (  Non-Medical): Not on file  Physical Activity:   . Days of Exercise per Week: Not on file  . Minutes of Exercise per Session: Not on file  Stress:   . Feeling of Stress : Not on file  Social Connections:   . Frequency of Communication with Friends and Family: Not on file  . Frequency of Social Gatherings with Friends and Family: Not on file  . Attends Religious Services: Not on file  . Active Member of Clubs or Organizations: Not on file  . Attends Banker Meetings: Not on file  . Marital Status: Not on file  Intimate Partner Violence:   . Fear of Current or Ex-Partner: Not on file  . Emotionally Abused: Not on file  . Physically Abused: Not on file  .  Sexually Abused: Not on file          Mardella Layman, MD 03/10/20 1013

## 2020-03-10 NOTE — Discharge Instructions (Addendum)
You have been given the following today for treatment of possible chlamydia:  Meds ordered this encounter  Medications   azithromycin (ZITHROMAX) tablet 1,000 mg    Even though we have treated you today, we have sent testing for sexually transmitted infections. We will notify you of any positive results once they are received. If required, we will prescribe any medications you might need.  Please refrain from all sexual activity for at least the next seven days.  

## 2020-03-11 LAB — CERVICOVAGINAL ANCILLARY ONLY
Bacterial Vaginitis (gardnerella): POSITIVE — AB
Candida Glabrata: NEGATIVE
Candida Vaginitis: POSITIVE — AB
Chlamydia: NEGATIVE
Comment: NEGATIVE
Comment: NEGATIVE
Comment: NEGATIVE
Comment: NEGATIVE
Comment: NEGATIVE
Comment: NORMAL
Neisseria Gonorrhea: NEGATIVE
Trichomonas: NEGATIVE

## 2020-03-12 ENCOUNTER — Telehealth (HOSPITAL_COMMUNITY): Payer: Self-pay | Admitting: Emergency Medicine

## 2020-03-12 MED ORDER — METRONIDAZOLE 500 MG PO TABS: 500.0000 mg | ORAL_TABLET | Freq: Two times a day (BID) | ORAL | 0 refills | Status: DC

## 2020-03-12 MED ORDER — FLUCONAZOLE 150 MG PO TABS: 150.0000 mg | ORAL_TABLET | Freq: Once | ORAL | 0 refills | Status: AC

## 2020-04-23 ENCOUNTER — Other Ambulatory Visit: Payer: Self-pay

## 2020-04-23 ENCOUNTER — Emergency Department (HOSPITAL_COMMUNITY)
Admission: EM | Admit: 2020-04-23 | Discharge: 2020-04-23 | Disposition: A | Discharge status: Home / Self Care | Payer: HRSA Program | Attending: Emergency Medicine | Admitting: Emergency Medicine

## 2020-04-23 DIAGNOSIS — Z5321 Procedure and treatment not carried out due to patient leaving prior to being seen by health care provider: Secondary | ICD-10-CM | POA: Insufficient documentation

## 2020-04-23 DIAGNOSIS — G43909 Migraine, unspecified, not intractable, without status migrainosus: Secondary | ICD-10-CM | POA: Insufficient documentation

## 2020-04-23 DIAGNOSIS — U071 COVID-19: Secondary | ICD-10-CM | POA: Insufficient documentation

## 2020-04-23 DIAGNOSIS — M791 Myalgia, unspecified site: Secondary | ICD-10-CM | POA: Diagnosis present

## 2020-04-23 LAB — RESP PANEL BY RT-PCR (FLU A&B, COVID) ARPGX2
Influenza A by PCR: NEGATIVE
Influenza B by PCR: NEGATIVE
SARS Coronavirus 2 by RT PCR: POSITIVE — AB

## 2020-04-23 MED ORDER — ACETAMINOPHEN 500 MG PO TABS
1000.0000 mg | ORAL_TABLET | Freq: Once | ORAL | Status: DC
  Filled 2020-04-23: qty 2

## 2020-04-23 NOTE — ED Triage Notes (Signed)
Pt here with bodyaches and migraine that woke her up out of her sleep. Pt denies having covid vaccine. Pt temps 99.4.

## 2020-04-23 NOTE — ED Notes (Signed)
Called x3 without answer.
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
## 2021-07-07 ENCOUNTER — Encounter (HOSPITAL_COMMUNITY): Payer: Self-pay

## 2021-07-07 ENCOUNTER — Emergency Department (HOSPITAL_COMMUNITY): Payer: Self-pay

## 2021-07-07 ENCOUNTER — Emergency Department (HOSPITAL_COMMUNITY)
Admission: EM | Admit: 2021-07-07 | Discharge: 2021-07-07 | Disposition: A | Discharge status: Home / Self Care | Payer: Self-pay | Attending: Emergency Medicine | Admitting: Emergency Medicine

## 2021-07-07 DIAGNOSIS — S00211A Abrasion of right eyelid and periocular area, initial encounter: Secondary | ICD-10-CM | POA: Insufficient documentation

## 2021-07-07 DIAGNOSIS — S0512XA Contusion of eyeball and orbital tissues, left eye, initial encounter: Secondary | ICD-10-CM | POA: Insufficient documentation

## 2021-07-07 MED ORDER — OXYCODONE-ACETAMINOPHEN 5-325 MG PO TABS
1.0000 | ORAL_TABLET | Freq: Once | ORAL | Status: AC
  Administered 2021-07-07: 1 via ORAL
  Filled 2021-07-07: qty 1

## 2021-07-07 MED ORDER — METHOCARBAMOL 500 MG PO TABS: 500.0000 mg | ORAL_TABLET | Freq: Two times a day (BID) | ORAL | 0 refills | Status: DC

## 2021-07-07 MED ORDER — ACETAMINOPHEN 325 MG PO TABS
650.0000 mg | ORAL_TABLET | Freq: Once | ORAL | Status: AC
  Administered 2021-07-07: 650 mg via ORAL
  Filled 2021-07-07: qty 2
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
## 2021-09-05 ENCOUNTER — Encounter (HOSPITAL_COMMUNITY): Payer: Self-pay | Admitting: Emergency Medicine

## 2021-09-05 ENCOUNTER — Other Ambulatory Visit: Payer: Self-pay

## 2021-09-05 ENCOUNTER — Emergency Department (HOSPITAL_COMMUNITY): Payer: Self-pay

## 2021-09-05 ENCOUNTER — Emergency Department (HOSPITAL_COMMUNITY)
Admission: EM | Admit: 2021-09-05 | Discharge: 2021-09-05 | Disposition: A | Discharge status: Home / Self Care | Payer: Self-pay | Attending: Emergency Medicine | Admitting: Emergency Medicine

## 2021-09-05 DIAGNOSIS — H5711 Ocular pain, right eye: Secondary | ICD-10-CM | POA: Insufficient documentation

## 2021-09-05 DIAGNOSIS — R519 Headache, unspecified: Secondary | ICD-10-CM | POA: Insufficient documentation

## 2021-09-05 MED ORDER — ACETAMINOPHEN 500 MG PO TABS
1000.0000 mg | ORAL_TABLET | Freq: Once | ORAL | Status: AC
  Administered 2021-09-05: 1000 mg via ORAL
  Filled 2021-09-05: qty 2
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
## 2021-09-05 NOTE — Discharge Instructions (Signed)
You were evaluated in the Emergency Department and after careful evaluation, we did not find any emergent condition requiring admission or further testing in the hospital.  Your exam/testing today was overall reassuring.  CT scans did not show any significant injuries.  Please return to the Emergency Department if you experience any worsening of your condition.  Thank you for allowing us to be a part of your care.  

## 2021-10-14 ENCOUNTER — Encounter (HOSPITAL_COMMUNITY): Payer: Self-pay | Admitting: Emergency Medicine

## 2021-10-14 ENCOUNTER — Ambulatory Visit (HOSPITAL_COMMUNITY)
Admission: EM | Admit: 2021-10-14 | Discharge: 2021-10-14 | Disposition: A | Discharge status: Home / Self Care | Payer: Medicaid Other | Attending: Family Medicine | Admitting: Family Medicine

## 2021-10-14 DIAGNOSIS — R1030 Lower abdominal pain, unspecified: Secondary | ICD-10-CM

## 2021-10-14 DIAGNOSIS — R102 Pelvic and perineal pain unspecified side: Secondary | ICD-10-CM

## 2021-10-14 LAB — POCT URINALYSIS DIPSTICK, ED / UC
Bilirubin Urine: NEGATIVE
Glucose, UA: NEGATIVE mg/dL
Hgb urine dipstick: NEGATIVE
Ketones, ur: NEGATIVE mg/dL
Leukocytes,Ua: NEGATIVE
Nitrite: NEGATIVE
Protein, ur: NEGATIVE mg/dL
Specific Gravity, Urine: 1.015 (ref 1.005–1.030)
Urobilinogen, UA: 0.2 mg/dL (ref 0.0–1.0)
pH: 8.5 — ABNORMAL HIGH (ref 5.0–8.0)

## 2021-10-14 LAB — POC URINE PREG, ED: Preg Test, Ur: NEGATIVE

## 2021-10-17 ENCOUNTER — Telehealth (HOSPITAL_COMMUNITY): Payer: Self-pay | Admitting: Physician Assistant

## 2021-10-17 LAB — CERVICOVAGINAL ANCILLARY ONLY
Bacterial Vaginitis (gardnerella): POSITIVE — AB
Candida Glabrata: NEGATIVE
Candida Vaginitis: NEGATIVE
Chlamydia: POSITIVE — AB
Comment: NEGATIVE
Comment: NEGATIVE
Comment: NEGATIVE
Comment: NEGATIVE
Comment: NEGATIVE
Comment: NORMAL
Neisseria Gonorrhea: NEGATIVE
Trichomonas: POSITIVE — AB

## 2021-10-17 MED ORDER — DOXYCYCLINE HYCLATE 100 MG PO CAPS: 100.0000 mg | ORAL_CAPSULE | Freq: Two times a day (BID) | ORAL | 0 refills | Status: DC

## 2021-10-17 MED ORDER — METRONIDAZOLE 500 MG PO TABS: 500.0000 mg | ORAL_TABLET | Freq: Two times a day (BID) | ORAL | 0 refills | Status: DC

## 2021-12-06 ENCOUNTER — Encounter (HOSPITAL_COMMUNITY): Payer: Self-pay

## 2021-12-06 ENCOUNTER — Ambulatory Visit (HOSPITAL_COMMUNITY)
Admission: EM | Admit: 2021-12-06 | Discharge: 2021-12-06 | Disposition: A | Discharge status: Home / Self Care | Payer: Medicaid Other | Attending: Physician Assistant | Admitting: Physician Assistant

## 2021-12-06 DIAGNOSIS — Z202 Contact with and (suspected) exposure to infections with a predominantly sexual mode of transmission: Secondary | ICD-10-CM

## 2021-12-06 DIAGNOSIS — Z113 Encounter for screening for infections with a predominantly sexual mode of transmission: Secondary | ICD-10-CM | POA: Insufficient documentation

## 2021-12-06 LAB — POC URINE PREG, ED: Preg Test, Ur: NEGATIVE

## 2021-12-06 NOTE — ED Triage Notes (Signed)
Pt comes in to see if her STDs have cleared up since last appt. Pt is wanting to get retested.  Pt states she is not having any S/S of any STDs

## 2021-12-06 NOTE — Discharge Instructions (Addendum)
Labs have been taking for testing, these will be completed in 48 hours, he confuses results on MyChart at 24 and 48 hours.  If this office does not contact you within 48 hours then that indicates that the labs are all negative or normal. Advised to follow-up with PCP or return to urgent care as needed.

## 2021-12-06 NOTE — ED Provider Notes (Signed)
[facility]    CSN: [id number] Arrival date & time: 12/06/21  1546      History   Chief Complaint No chief complaint on file.   HPI Barbara Ramos is a 31 y.o. female.   31 year old female presents for STI testing.  Patient indicates that she is presently not having any symptoms, no vaginal discharge.  Patient's desires to be tested for STIs to include HIV and RPR screening.  Patient wants to make sure that she is not infected any longer since she was positive for chlamydia, trichomonas, and bacterial vaginosis in June/2023. Patient request to have a pregnancy test today.     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 Authorizing Provider  doxycycline (VIBRAMYCIN) 100 MG capsule Take 1 capsule (100 mg total) by mouth 2 (two) times daily. 10/17/21   Raspet, Noberto Retort, PA-C  methocarbamol (ROBAXIN) 500 MG tablet Take 1 tablet (500 mg total) by mouth 2 (two) times daily. 07/07/21   Gailen Shelter, PA  metroNIDAZOLE (FLAGYL) 500 MG tablet Take 1 tablet (500 mg total) by mouth 2 (two) times daily. 10/17/21   Raspet, Noberto Retort, PA-C  ondansetron (ZOFRAN ODT) 4 MG disintegrating tablet Take 1 tablet (4 mg total) by mouth every 8 (eight) hours as needed  for nausea or vomiting. 09/15/19   Wieters, Hallie C, PA-C  ferrous sulfate 325 (65 FE) MG tablet Take 1 tablet (325 mg total) by mouth daily with breakfast. Patient not taking: Reported on 04/04/2019 06/19/18 09/15/19  Adam Phenix, MD    Family History Family History  Problem Relation Age of Onset   Healthy Mother    Healthy Father     Social History Social History   Tobacco Use   Smoking status: Former    Packs/day: 0.01    Types: Cigarettes    Quit date: 09/23/2017    Years since quitting: 4.2   Smokeless tobacco: Never  Vaping Use   Vaping Use: Never used  Substance Use Topics   Alcohol use: Yes    Comment: occ   Drug use: No     Allergies   Ibuprofen and Pollen extract   Review of Systems Review of Systems   Physical Exam Triage Vital Signs ED Triage Vitals  Enc Vitals Group     BP 12/06/21 1617 123/84     Pulse Rate 12/06/21 1615 75  Resp 12/06/21 1615 16     Temp 12/06/21 1615 97.7 F (36.5 C)     Temp Source 12/06/21 1615 Oral     SpO2 12/06/21 1615 98 %     Weight --      Height --      Head Circumference --      Peak Flow --      Pain Score 12/06/21 1616 0     Pain Loc --      Pain Edu? --      Excl. in [facility]? --    No data found.  Updated Vital Signs BP 123/84 (BP Location: Right Arm)   Pulse 75   Temp 97.7 F (36.5 C) (Oral)   Resp 16   LMP 11/04/2021 (Approximate)   SpO2 98%   Visual Acuity Right Eye Distance:   Left Eye Distance:   Bilateral Distance:    Right Eye Near:   Left Eye Near:    Bilateral Near:     Physical Exam Constitutional:      Appearance: Normal appearance.  Neurological:     Mental Status: She is alert.      UC Treatments / Results  Labs (all labs ordered are listed, but only abnormal results are displayed) Labs Reviewed  RPR  HIV ANTIBODY (ROUTINE TESTING W REFLEX)  CERVICOVAGINAL ANCILLARY ONLY    EKG   Radiology No results found.  Procedures Procedures (including critical care  time)  Medications Ordered in UC Medications - No data to display  Initial Impression / Assessment and Plan / UC Course  I have reviewed the triage vital signs and the nursing notes.  Pertinent labs & imaging results that were available during my care of the patient were reviewed by me and considered in my medical decision making (see chart for details).    Plan: 1.  STI testing to include HIV and RPR is pending. 2.  Advised to follow-up PCP or return to urgent care if symptoms fail to improve. Final Clinical Impressions(s) / UC Diagnoses   Final diagnoses:  Routine screening for STI (sexually transmitted infection)     Discharge Instructions      Labs have been taking for testing, these will be completed in 48 hours, he confuses results on MyChart at 24 and 48 hours.  If this office does not contact you within 48 hours then that indicates that the labs are all negative or normal. Advised to follow-up with PCP or return to urgent care as needed.   ED Prescriptions   None    PDMP not reviewed this encounter.   Ellsworth Lennox, PA-C 12/06/21 1642

## 2021-12-07 ENCOUNTER — Telehealth (HOSPITAL_COMMUNITY): Payer: Self-pay | Admitting: Emergency Medicine

## 2021-12-07 LAB — CERVICOVAGINAL ANCILLARY ONLY
Bacterial Vaginitis (gardnerella): POSITIVE — AB
Candida Glabrata: NEGATIVE
Candida Vaginitis: POSITIVE — AB
Chlamydia: NEGATIVE
Comment: NEGATIVE
Comment: NEGATIVE
Comment: NEGATIVE
Comment: NEGATIVE
Comment: NEGATIVE
Comment: NORMAL
Neisseria Gonorrhea: NEGATIVE
Trichomonas: NEGATIVE

## 2021-12-07 LAB — RPR: RPR Ser Ql: NONREACTIVE

## 2021-12-07 LAB — HIV ANTIBODY (ROUTINE TESTING W REFLEX): HIV Screen 4th Generation wRfx: REACTIVE — AB

## 2021-12-07 NOTE — Telephone Encounter (Signed)
Patient called after seeing reactive results for HIV in her mychart.  This RN explained that we are waiting on confirmatory testing before we need to do anything.  Patient was crying and, obviously, upset.  This RN attempted to reassure her as much as possible and patient verbalized understanding of need for f/u results prior to decision making

## 2021-12-09 ENCOUNTER — Telehealth (HOSPITAL_COMMUNITY): Payer: Self-pay | Admitting: Emergency Medicine

## 2021-12-09 MED ORDER — FLUCONAZOLE 150 MG PO TABS: 150.0000 mg | ORAL_TABLET | Freq: Once | ORAL | 0 refills | Status: AC

## 2021-12-09 MED ORDER — METRONIDAZOLE 0.75 % VA GEL: 1.0000 | Freq: Every day | VAGINAL | 0 refills | Status: AC

## 2021-12-11 LAB — HIV-1/HIV-2 QUALITATIVE RNA
Final Interpretation: NEGATIVE
HIV-1 RNA, Qualitative: NONREACTIVE
HIV-2 RNA, Qualitative: NONREACTIVE

## 2021-12-11 LAB — HIV-1/2 AB - DIFFERENTIATION
HIV 1 Ab: NONREACTIVE
HIV 2 Ab: NONREACTIVE
Note: NEGATIVE

## 2021-12-27 ENCOUNTER — Encounter: Payer: Medicaid Other | Admitting: Obstetrics & Gynecology

## 2022-02-01 ENCOUNTER — Ambulatory Visit (HOSPITAL_COMMUNITY)
Admission: EM | Admit: 2022-02-01 | Discharge: 2022-02-01 | Disposition: A | Discharge status: Home / Self Care | Payer: Medicaid Other | Attending: Physician Assistant | Admitting: Physician Assistant

## 2022-02-01 ENCOUNTER — Encounter (HOSPITAL_COMMUNITY): Payer: Self-pay

## 2022-02-01 DIAGNOSIS — J069 Acute upper respiratory infection, unspecified: Secondary | ICD-10-CM | POA: Insufficient documentation

## 2022-02-01 DIAGNOSIS — R051 Acute cough: Secondary | ICD-10-CM | POA: Insufficient documentation

## 2022-02-01 DIAGNOSIS — Z3202 Encounter for pregnancy test, result negative: Secondary | ICD-10-CM

## 2022-02-01 DIAGNOSIS — Z1152 Encounter for screening for COVID-19: Secondary | ICD-10-CM | POA: Insufficient documentation

## 2022-02-01 LAB — POC URINE PREG, ED: Preg Test, Ur: NEGATIVE

## 2022-02-01 LAB — RESP PANEL BY RT-PCR (FLU A&B, COVID) ARPGX2
Influenza A by PCR: NEGATIVE
Influenza B by PCR: NEGATIVE
SARS Coronavirus 2 by RT PCR: NEGATIVE

## 2022-02-01 MED ORDER — PROMETHAZINE-DM 6.25-15 MG/5ML PO SYRP: 5.0000 mL | ORAL_SOLUTION | Freq: Three times a day (TID) | ORAL | 0 refills | Status: DC | PRN

## 2022-02-01 MED ORDER — FLUTICASONE PROPIONATE 50 MCG/ACT NA SUSP: 1.0000 | Freq: Every day | NASAL | 0 refills | Status: DC

## 2022-02-01 NOTE — ED Provider Notes (Signed)
[facility]    CSN: [id number] Arrival date & time: 02/01/22  2952      History   Chief Complaint Chief Complaint  Patient presents with   Cough   Back Pain   Sore Throat    HPI Barbara Ramos is a 31 y.o. female.   Patient presents today with a 3-day history of URI symptoms including cough, sore throat, headache.  Denies any fever, chest pain, shortness of breath, nausea, vomiting, diarrhea.  Reports her significant other has been sick but denies formal diagnosis.  She has not had COVID vaccines but has had COVID-19 several years ago.  She denies any significant past medical history including allergies, asthma, COPD.  She has not been taking any over-the-counter medication for symptom management.  She is a former smoker but quit several years ago.  She denies any history of diabetes, immunosuppression, chronic liver/kidney disease, cardiovascular disease.  She does not believe that she is pregnant but is unsure if she is not currently seeing any birth control.    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 Authorizing Provider  fluticasone (FLONASE) 50 MCG/ACT nasal spray Place 1 spray into both nostrils daily. 02/01/22  Yes Ran Tullis  K, PA-C  promethazine-dextromethorphan (PROMETHAZINE-DM) 6.25-15 MG/5ML syrup Take 5 mLs by mouth 3 (three) times daily as needed for cough. 02/01/22  Yes Donita Newland, Noberto Retort, PA-C  ferrous sulfate 325 (65 FE) MG tablet Take 1 tablet (325 mg total) by mouth daily with breakfast. Patient not taking: Reported on 04/04/2019 06/19/18 09/15/19  Adam Phenix, MD    Family History Family History  Problem Relation Age of Onset   Healthy Mother    Healthy Father     Social History Social History   Tobacco Use   Smoking status: Former    Packs/day: 0.01    Types: Cigarettes    Quit date: 09/23/2017    Years since quitting: 4.3   Smokeless tobacco: Never  Vaping Use   Vaping Use: Never used  Substance Use Topics   Alcohol use: Yes    Comment: occ   Drug use: No     Allergies   Ibuprofen and Pollen extract   Review of Systems Review of Systems  Constitutional:  Positive for activity change. Negative for appetite change, fatigue and fever.  HENT:  Positive for congestion, postnasal drip, sinus pressure and sore  throat. Negative for sneezing.   Respiratory:  Positive for cough. Negative for shortness of breath.   Cardiovascular:  Negative for chest pain.  Gastrointestinal:  Negative for abdominal pain, diarrhea, nausea and vomiting.  Neurological:  Positive for headaches. Negative for dizziness and light-headedness.     Physical Exam Triage Vital Signs ED Triage Vitals  Enc Vitals Group     BP 02/01/22 1031 136/68     Pulse Rate 02/01/22 1031 85     Resp 02/01/22 1031 16     Temp 02/01/22 1031 98.9 F (37.2 C)     Temp Source 02/01/22 1031 Oral     SpO2 02/01/22 1031 96 %     Weight --      Height --      Head Circumference --      Peak Flow --      Pain Score 02/01/22 1032 5     Pain Loc --      Pain Edu? --      Excl. in GC? --    No data found.  Updated Vital Signs BP 136/68   Pulse 85   Temp 98.9 F (37.2 C) (Oral)   Resp 16   LMP 01/17/2022   SpO2 96%    Visual Acuity Right Eye Distance:   Left Eye Distance:   Bilateral Distance:    Right Eye Near:   Left Eye Near:    Bilateral Near:     Physical Exam Vitals reviewed.  Constitutional:      General: She is awake. She is not in acute distress.    Appearance: Normal appearance. She is well-developed. She is not ill-appearing.     Comments: Very pleasant female appears stated age in no acute distress sitting comfortably in exam room  HENT:     Head: Normocephalic and atraumatic.     Right Ear: Tympanic membrane, ear canal and external ear normal. Tympanic membrane is not erythematous or bulging.     Left Ear: Tympanic membrane, ear canal and external ear normal. Tympanic membrane is not erythematous or bulging.     Nose: Congestion and rhinorrhea present. Rhinorrhea is clear.     Right Sinus: No maxillary sinus tenderness or frontal sinus tenderness.     Left Sinus: No maxillary sinus tenderness or frontal sinus tenderness.     Mouth/Throat:     Pharynx: Uvula midline. No oropharyngeal exudate or posterior oropharyngeal erythema.  Cardiovascular:     Rate and Rhythm: Normal rate and regular rhythm.     Heart sounds: Normal heart sounds, S1 normal and S2 normal. No murmur heard. Pulmonary:     Effort: Pulmonary effort is normal.     Breath sounds: Normal breath sounds. No wheezing, rhonchi or rales.     Comments: Clear to auscultation bilaterally Psychiatric:        Behavior: Behavior is cooperative.      UC Treatments / Results  Labs (all labs ordered are listed, but only abnormal results are displayed) Labs Reviewed  RESP PANEL BY RT-PCR (FLU A&B, COVID) [id number]  POC URINE PREG, ED    EKG   Radiology No results found.  Procedures Procedures (including critical care time)  Medications Ordered in UC Medications - No data to display  Initial Impression / Assessment and Plan / UC Course  I have reviewed the triage vital signs and the nursing notes.  Pertinent  labs & imaging results that were available during my care of the patient were reviewed by  me and considered in my medical decision making (see chart for details).     Patient is well-appearing, afebrile, nontoxic, nontachycardic.  No indication for antibiotics based on exam today.  Concern for viral etiology given short duration of symptoms and clinical presentation.  She was tested for COVID and flu.  She is outside the window of effectiveness for antivirals for influenza.  Given her young age and no significant past medical history would defer antivirals for COVID-19 even if she is positive.  She was encouraged to symptomatic management with over-the-counter medications.  She was given Flonase for nasal congestion but instructed to also use nasal saline/sinus rinses.  She was prescribed Promethazine DM for cough with instruction not to drive drink alcohol while taking this medication as drowsiness is a common side effect.  She is to rest and drink plenty of fluid.  Discussed that if her symptoms are not improving within a week she should return for reevaluation.  If she has any worsening symptoms including chest pain, shortness of breath, fever, nausea, vomiting, weakness she needs to go to the emergency room immediately to which she expressed understanding.  Strict return precautions given.  Work excuse note provided with current CDC return to work guidelines based on results.  Final Clinical Impressions(s) / UC Diagnoses   Final diagnoses:  Upper respiratory tract infection, unspecified type  Acute cough     Discharge Instructions      Your urine pregnancy test was negative.  I am concerned that you have a virus.  We tested you for flu and COVID I will contact you if this is positive.  Please monitor your MyChart for these results.  Use Promethazine DM for cough.  This will make you sleepy do not drive or drink alcohol while taking it.  Use Flonase for congestion.  You can use over-the-counter  medications including Tylenol, ibuprofen, Mucinex.  Make sure you rest and drink plenty of fluid.  If your symptoms are not improving by next week please return for reevaluation.  If anything worsens please return immediately including high fever, chest pain, shortness of breath, weakness, nausea/vomiting interfere with oral intake, sleeping all the time and difficult to wake up.     ED Prescriptions     Medication Sig Dispense Auth. Provider   promethazine-dextromethorphan (PROMETHAZINE-DM) 6.25-15 MG/5ML syrup Take 5 mLs by mouth 3 (three) times daily as needed for cough. 118 mL Chan Rosasco K, PA-C   fluticasone (FLONASE) 50 MCG/ACT nasal spray Place 1 spray into both nostrils daily. 16 g Kenasia Scheller K, PA-C      PDMP not reviewed this encounter.   Terrilee Croak, PA-C 02/01/22 1119

## 2022-02-01 NOTE — ED Triage Notes (Signed)
Pt is here for cough, lower back pain , and sore throat. Pt denies painful swallowing and fever x3days

## 2022-02-01 NOTE — Discharge Instructions (Signed)
Your urine pregnancy test was negative.  I am concerned that you have a virus.  We tested you for flu and COVID I will contact you if this is positive.  Please monitor your MyChart for these results.  Use Promethazine DM for cough.  This will make you sleepy do not drive or drink alcohol while taking it.  Use Flonase for congestion.  You can use over-the-counter medications including Tylenol, ibuprofen, Mucinex.  Make sure you rest and drink plenty of fluid.  If your symptoms are not improving by next week please return for reevaluation.  If anything worsens please return immediately including high fever, chest pain, shortness of breath, weakness, nausea/vomiting interfere with oral intake, sleeping all the time and difficult to wake up.

## 2022-03-29 ENCOUNTER — Encounter (HOSPITAL_COMMUNITY): Payer: Self-pay

## 2022-03-29 ENCOUNTER — Ambulatory Visit (HOSPITAL_COMMUNITY)
Admission: RE | Admit: 2022-03-29 | Discharge: 2022-03-29 | Disposition: A | Discharge status: Home / Self Care | Payer: Medicaid Other | Source: Ambulatory Visit | Attending: Emergency Medicine | Admitting: Emergency Medicine

## 2022-03-29 VITALS — BP 114/67 | HR 78 | Temp 97.5°F | Resp 16

## 2022-03-29 DIAGNOSIS — Z113 Encounter for screening for infections with a predominantly sexual mode of transmission: Secondary | ICD-10-CM | POA: Diagnosis not present

## 2022-03-29 DIAGNOSIS — R102 Pelvic and perineal pain: Secondary | ICD-10-CM | POA: Diagnosis not present

## 2022-03-29 LAB — POCT URINALYSIS DIPSTICK, ED / UC
Bilirubin Urine: NEGATIVE
Glucose, UA: NEGATIVE mg/dL
Hgb urine dipstick: NEGATIVE
Ketones, ur: NEGATIVE mg/dL
Leukocytes,Ua: NEGATIVE
Nitrite: NEGATIVE
Protein, ur: NEGATIVE mg/dL
Specific Gravity, Urine: 1.025 (ref 1.005–1.030)
Urobilinogen, UA: 0.2 mg/dL (ref 0.0–1.0)
pH: 7 (ref 5.0–8.0)

## 2022-03-29 LAB — POC URINE PREG, ED: Preg Test, Ur: NEGATIVE

## 2022-03-29 NOTE — ED Provider Notes (Signed)
[facility]    CSN: [id number] Arrival date & time: 03/29/22  1204      History   Chief Complaint Chief Complaint  Patient presents with   Abdominal Pain    HPI Barbara Ramos is a 31 y.o. female.  Presents for STD testing Reports 2/10 lower abdominal discomfort for the last 4 days Denies dysuria, hematuria, frequency No vaginal discharge No fevers. 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 Authorizing Provider  fluticasone (FLONASE) 50 MCG/ACT nasal spray Place 1 spray into both nostrils daily. 02/01/22   Raspet, Noberto Retort, PA-C  promethazine-dextromethorphan (PROMETHAZINE-DM) 6.25-15 MG/5ML syrup Take 5 mLs by mouth 3 (three) times daily as needed for cough. 02/01/22   Raspet, Noberto Retort, PA-C  ferrous sulfate 325 (65 FE) MG tablet Take 1 tablet (325 mg total) by mouth daily with breakfast. Patient not taking: Reported on 04/04/2019 06/19/18 09/15/19  Adam Phenix, MD    Family History Family History  Problem Relation Age of Onset   Healthy Mother    Healthy Father     Social History Social History   Tobacco Use   Smoking status: Former    Packs/day: 0.01    Types: Cigarettes     Quit date: 09/23/2017    Years since quitting: 4.5   Smokeless tobacco: Never  Vaping Use   Vaping Use: Never used  Substance Use Topics   Alcohol use: Yes    Comment: occ   Drug use: No     Allergies   Ibuprofen and Pollen extract   Review of Systems Review of Systems Per HPI  Physical Exam Triage Vital Signs ED Triage Vitals  Enc Vitals Group     BP 03/29/22 1214 114/67     Pulse Rate 03/29/22 1214 78     Resp 03/29/22 1214 16     Temp 03/29/22 1214 (!) 97.5 F (36.4 C)     Temp Source 03/29/22 1214 Oral     SpO2 03/29/22 1214 98 %     Weight --      Height --      Head Circumference --      Peak Flow --      Pain Score 03/29/22 1216 2     Pain Loc --      Pain Edu? --  Excl. in GC? --    No data found.  Updated Vital Signs BP 114/67 (BP Location: Left Arm)   Pulse 78   Temp (!) 97.5 F (36.4 C) (Oral)   Resp 16   LMP 03/05/2022   SpO2 98%    Physical Exam Vitals and nursing note reviewed.  Constitutional:      General: She is not in acute distress.    Appearance: Normal appearance.  HENT:     Mouth/Throat:     Pharynx: Oropharynx is clear.  Cardiovascular:     Rate and Rhythm: Normal rate and regular rhythm.     Pulses: Normal pulses.     Heart sounds: Normal heart sounds.  Pulmonary:     Effort: Pulmonary effort is normal.     Breath sounds: Normal breath sounds.  Abdominal:     Palpations: Abdomen is soft.     Tenderness: There is no abdominal tenderness.  Neurological:     Mental Status: She is alert and oriented to person, place, and time.     UC Treatments / Results  Labs (all labs ordered are listed, but only abnormal results are displayed) Labs Reviewed  POCT URINALYSIS DIPSTICK, ED / UC  POC URINE PREG, ED  CERVICOVAGINAL ANCILLARY ONLY    EKG  Radiology No results found.  Procedures Procedures   Medications Ordered in UC Medications - No data to display  Initial Impression / Assessment and Plan / UC Course   I have reviewed the triage vital signs and the nursing notes.  Pertinent labs & imaging results that were available during my care of the patient were reviewed by me and considered in my medical decision making (see chart for details).  Abdomen non tender on exam Urinalysis unremarkable. UPT negative. Cytology swab pending. Had recent RPR/HIV test, will defer today Discussed monitoring abd discomfort and returning as needed. Patient agrees to plan  Final Clinical Impressions(s) / UC Diagnoses   Final diagnoses:  Screen for STD (sexually transmitted disease)  Suprapubic pain     Discharge Instructions      We will call you if anything on your swab returns positive. Please abstain from sexual intercourse until your results return.  Please monitor symptoms and return as needed, or follow with your ob/gyn     ED Prescriptions   None    PDMP not reviewed this encounter.   Maison Kestenbaum, Lurena Joiner, PA-C 03/29/22 1301

## 2022-03-29 NOTE — ED Triage Notes (Signed)
Pt states she would like to be tested for STDs states she is having some lower abdominal pain.

## 2022-03-29 NOTE — Discharge Instructions (Signed)
We will call you if anything on your swab returns positive. Please abstain from sexual intercourse until your results return.  Please monitor symptoms and return as needed, or follow with your ob/gyn

## 2022-03-30 ENCOUNTER — Telehealth (HOSPITAL_COMMUNITY): Payer: Self-pay | Admitting: Emergency Medicine

## 2022-03-30 LAB — CERVICOVAGINAL ANCILLARY ONLY
Bacterial Vaginitis (gardnerella): POSITIVE — AB
Candida Glabrata: NEGATIVE
Candida Vaginitis: NEGATIVE
Chlamydia: NEGATIVE
Comment: NEGATIVE
Comment: NEGATIVE
Comment: NEGATIVE
Comment: NEGATIVE
Comment: NEGATIVE
Comment: NORMAL
Neisseria Gonorrhea: NEGATIVE
Trichomonas: NEGATIVE

## 2022-03-30 MED ORDER — METRONIDAZOLE 500 MG PO TABS: 500.0000 mg | ORAL_TABLET | Freq: Two times a day (BID) | ORAL | 0 refills | Status: DC

## 2022-04-10 ENCOUNTER — Encounter (HOSPITAL_COMMUNITY): Payer: Self-pay

## 2022-04-10 ENCOUNTER — Emergency Department (HOSPITAL_COMMUNITY)
Admission: EM | Admit: 2022-04-10 | Discharge: 2022-04-10 | Discharge status: Against Medical Advice | Payer: Medicaid Other | Attending: Emergency Medicine | Admitting: Emergency Medicine

## 2022-04-10 ENCOUNTER — Other Ambulatory Visit: Payer: Self-pay

## 2022-04-10 DIAGNOSIS — Z5321 Procedure and treatment not carried out due to patient leaving prior to being seen by health care provider: Secondary | ICD-10-CM | POA: Insufficient documentation

## 2022-04-10 DIAGNOSIS — N926 Irregular menstruation, unspecified: Secondary | ICD-10-CM | POA: Diagnosis not present

## 2022-04-10 LAB — POC URINE PREG, ED: Preg Test, Ur: NEGATIVE

## 2022-04-10 NOTE — ED Provider Triage Note (Signed)
Emergency Medicine Provider Triage Evaluation Note  Barbara Ramos , a 31 y.o. female  was evaluated in triage.  Pt complains of regular periods going on for several months.  Patient states that her period came early this month.  She states that her periods are only lasting 2 to 4 days she has had recent STI checking does not wish to have STI takes.  She wants to make sure "that I am fertile." I explained scope of practice of the emergency department and what we can offer.  She would like to be tested for pregnancy..  Review of Systems  Positive: Irregular periods Negative: Heavy bleeding  Physical Exam  BP (!) 121/91 (BP Location: Right Arm)   Pulse 80   Temp 98.1 F (36.7 C) (Oral)   Resp 16   Ht 5\' 4"  (1.626 m)   Wt 78.5 kg   LMP 04/10/2022 (Exact Date)   SpO2 100%   BMI 29.70 kg/m  Gen:   Awake, no distress   Resp:  Normal effort  MSK:   Moves extremities without difficulty  Other:  No abd distension  Medical Decision Making  Medically screening exam initiated at 1:10 PM.  Appropriate orders placed.  Shiza 04/12/2022 was informed that the remainder of the evaluation will be completed by another provider, this initial triage assessment does not replace that evaluation, and the importance of remaining in the ED until their evaluation is complete.     Veneta Penton, PA-C 04/10/22 1312

## 2022-04-10 NOTE — ED Triage Notes (Signed)
Patient  reports that she is having irregular periods and are not lasting s long as they normally do. Patient states this has been occurring approx 3 months.  Patient denies any pain.

## 2022-04-10 NOTE — Discharge Instructions (Addendum)
Usually, you will get your period about every 28 days if you do not get pregnant. However, some women get their periods as soon as every 21 days or as late as every 45 days.  It is a 40 to use a.  Tracker because here.  Is not likely to fall on the same day every month which does not mean that you are having an abnormal menstrual cycle.

## 2022-09-27 ENCOUNTER — Ambulatory Visit (HOSPITAL_COMMUNITY)
Admission: EM | Admit: 2022-09-27 | Discharge: 2022-09-27 | Disposition: A | Discharge status: Home / Self Care | Payer: Medicaid Other | Attending: Emergency Medicine | Admitting: Emergency Medicine

## 2022-09-27 ENCOUNTER — Encounter (HOSPITAL_COMMUNITY): Payer: Self-pay

## 2022-09-27 DIAGNOSIS — Z113 Encounter for screening for infections with a predominantly sexual mode of transmission: Secondary | ICD-10-CM | POA: Diagnosis present

## 2022-09-27 DIAGNOSIS — L509 Urticaria, unspecified: Secondary | ICD-10-CM | POA: Diagnosis present

## 2022-09-27 MED ORDER — METHYLPREDNISOLONE SODIUM SUCC 125 MG IJ SOLR
60.0000 mg | Freq: Once | INTRAMUSCULAR | Status: AC
  Administered 2022-09-27: 60 mg via INTRAMUSCULAR

## 2022-09-27 NOTE — ED Triage Notes (Signed)
Pt c/o hives since noon today and lip swelling since 145. Unknown allergies. Speaking in complete sentence. Denies taking any meds. Pt also wants std testing. Denies sx's.

## 2022-09-27 NOTE — Discharge Instructions (Addendum)
The steroid injection should help reduce itching and the swelling of your lower lip. I recommend to take 50 mg Benadryl when you get home.  Continue Benadryl every 6 hours as well.  If at any point your symptoms worsen, especially if you have shortness of breath, trouble breathing, sensation of throat closing, please go directly to the emergency department.   We will call you if anything on your swab returns positive. Please abstain from sexual intercourse until your results return.

## 2022-09-27 NOTE — ED Provider Notes (Signed)
[facility]    CSN: [id number] Arrival date & time: 09/27/22  1849      History   Chief Complaint Chief Complaint  Patient presents with   Allergic Reaction   SEXUALLY TRANSMITTED DISEASE    HPI Barbara Ramos is a 32 y.o. female.  Reports about 8 hour history of hives, bottom lip swelling. All over itching. Not having any shortness of breath or trouble breathing. No wheezing.  Denies history of allergies or allergic reaction. 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 Authorizing Provider  ferrous sulfate 325 (65 FE) MG tablet Take 1 tablet (325 mg total) by mouth daily with breakfast. Patient not taking: Reported on 04/04/2019 06/19/18 09/15/19  Adam Phenix, MD    Family History Family History  Problem Relation Age of Onset   Healthy Mother    Healthy Father     Social History Social History   Tobacco Use   Smoking status: Former    Packs/day: .01    Types: Cigarettes    Quit date: 09/23/2017    Years since quitting: 5.0   Smokeless tobacco: Never  Vaping Use   Vaping Use: Never used   Substance Use Topics   Alcohol use: Yes    Comment: occ   Drug use: No     Allergies   Ibuprofen and Pollen extract   Review of Systems Review of Systems As per HPI  Physical Exam Triage Vital Signs ED Triage Vitals [09/27/22 1948]  Enc Vitals Group     BP (!) 144/84     Pulse Rate (!) 101     Resp 18     Temp 98 F (36.7 C)     Temp Source Oral     SpO2 98 %     Weight      Height      Head Circumference      Peak Flow      Pain Score 6     Pain Loc      Pain Edu?      Excl. in GC?    No data found.  Updated Vital Signs BP (!) 144/84 (BP Location: Left Arm)   Pulse (!) 101   Temp 98 F (36.7 C) (  Oral)   Resp 18   LMP 09/23/2022   SpO2 98%    Physical Exam Vitals and nursing note reviewed.  Constitutional:      General: She is not in acute distress.    Appearance: Normal appearance.  HENT:     Head:     Comments: Bottom lip is swollen. No swelling noted to rest of face. No tongue swelling. Normal phonation.    Mouth/Throat:     Mouth: Mucous membranes are moist.     Pharynx: Oropharynx is clear. No posterior oropharyngeal erythema.  Eyes:     Conjunctiva/sclera: Conjunctivae normal.     Pupils: Pupils are equal, round, and reactive to light.  Cardiovascular:     Rate and Rhythm: Normal rate and regular rhythm.     Pulses: Normal pulses.     Heart sounds: Normal heart sounds.  Pulmonary:     Effort: Pulmonary effort is normal. No respiratory distress.     Breath sounds: Normal breath sounds. No wheezing.  Abdominal:     General: Bowel sounds are normal.     Tenderness: There is no abdominal tenderness.  Musculoskeletal:        General: Normal range of motion.     Cervical back: Normal range of motion.  Skin:    Findings: Rash present.     Comments: Urticaria over bilateral arms, chest, back, legs  Neurological:     Mental Status: She is alert and oriented to person, place, and time.     UC Treatments / Results  Labs (all labs  ordered are listed, but only abnormal results are displayed) Labs Reviewed  CERVICOVAGINAL ANCILLARY ONLY    EKG  Radiology No results found.  Procedures Procedures (including critical care time)  Medications Ordered in UC Medications  methylPREDNISolone sodium succinate (SOLU-MEDROL) 125 mg/2 mL injection 60 mg (60 mg Intramuscular Given 09/27/22 2027)    Initial Impression / Assessment and Plan / UC Course  I have reviewed the triage vital signs and the nursing notes.  Pertinent labs & imaging results that were available during my care of the patient were reviewed by me and considered in my medical decision making (see chart for details).  Hives IM solumedrol given in clinic  Discussed monitoring for improvement, take benadryl at home. With any acute worsening of symptoms needs to be seen in the ED. Patient verbalizes understanding of precautions. She would like to go home instead of waiting for medicine to work.  STD screen Cytology swab pending. Treat positive if indicated.   Final Clinical Impressions(s) / UC Diagnoses   Final diagnoses:  Urticaria  Screen for STD (sexually transmitted disease)     Discharge Instructions      The steroid injection should help reduce itching and the swelling of your lower lip. I recommend to take 50 mg Benadryl when you get home.  Continue Benadryl every 6 hours as well.  If at any point your symptoms worsen, especially if you have shortness of breath, trouble breathing, sensation of throat closing, please go directly to the emergency department.   We will call you if anything on your swab returns positive. Please abstain from sexual intercourse until your results return.     ED Prescriptions   None    PDMP not reviewed this encounter.   Tehani Mersman, Ray Church 09/27/22 2049

## 2022-09-28 ENCOUNTER — Telehealth (HOSPITAL_COMMUNITY): Payer: Self-pay | Admitting: Emergency Medicine

## 2022-09-28 LAB — CERVICOVAGINAL ANCILLARY ONLY
Bacterial Vaginitis (gardnerella): POSITIVE — AB
Candida Glabrata: NEGATIVE
Candida Vaginitis: NEGATIVE
Chlamydia: NEGATIVE
Comment: NEGATIVE
Comment: NEGATIVE
Comment: NEGATIVE
Comment: NEGATIVE
Comment: NEGATIVE
Comment: NORMAL
Neisseria Gonorrhea: NEGATIVE
Trichomonas: NEGATIVE

## 2022-09-28 MED ORDER — METRONIDAZOLE 500 MG PO TABS: 500.0000 mg | ORAL_TABLET | Freq: Two times a day (BID) | ORAL | 0 refills | Status: DC

## 2022-11-13 ENCOUNTER — Ambulatory Visit (HOSPITAL_COMMUNITY)
Admission: EM | Admit: 2022-11-13 | Discharge: 2022-11-13 | Disposition: A | Discharge status: Home / Self Care | Payer: Medicaid Other | Attending: Emergency Medicine | Admitting: Emergency Medicine

## 2022-11-13 ENCOUNTER — Encounter (HOSPITAL_COMMUNITY): Payer: Self-pay

## 2022-11-13 DIAGNOSIS — Z113 Encounter for screening for infections with a predominantly sexual mode of transmission: Secondary | ICD-10-CM | POA: Insufficient documentation

## 2022-11-13 LAB — HIV ANTIBODY (ROUTINE TESTING W REFLEX): HIV Screen 4th Generation wRfx: NONREACTIVE

## 2022-11-13 NOTE — ED Triage Notes (Signed)
Patient here today to have all STD testing. She is not having any symptoms.

## 2022-11-13 NOTE — ED Provider Notes (Signed)
[facility]    CSN: [id number] Arrival date & time: 11/13/22  1737    HISTORY   Chief Complaint  Patient presents with   SEXUALLY TRANSMITTED DISEASE    Testing only   HPI Barbara Ramos is a pleasant, 32 y.o. female who presents to urgent care today. Pt is requesting routine STD testing. Patient denies burning with urination, suprapubic pain, perineal pain, flank pain, fever, chills, malaise, rigors, significant fatigue, abnormal vaginal discharge, abnormal vaginal odor, vaginal itching, vaginal irritation, dyspareunia, and known exposure to STD.  The history is provided by the patient.   Past Medical History:  Diagnosis Date   Allergy    Anxiety    Migraines    Sickle cell trait [facility]    Patient Active Problem List   Diagnosis Date Noted   Biological false positive RPR test 04/10/2019   History of syphilis 04/07/2019   SVD (spontaneous vaginal delivery) 06/18/2018   IUFD at 20 weeks or more of gestation 06/18/2018   Abruptio placenta, third trimester 06/17/2018   Past Surgical History:  Procedure Laterality Date   FOOT SURGERY     WISDOM TOOTH EXTRACTION     OB History     Gravida  5   Para  3   Term  2   Preterm  1   AB  2   Living  2      SAB      IAB      Ectopic      Multiple  0   Live Births  2          Home Medications    Prior to Admission medications   Medication Sig Start Date End Date Taking? Authorizing Provider  metroNIDAZOLE (FLAGYL) 500 MG tablet Take 1 tablet (500 mg total) by mouth 2 (two) times daily. 09/28/22   Merrilee Jansky, MD  ferrous sulfate 325 (65 FE) MG tablet Take 1 tablet (325 mg total) by mouth daily with breakfast. Patient not taking: Reported on 04/04/2019 06/19/18 09/15/19  Adam Phenix, MD    Family History Family History  Problem Relation Age of Onset   Healthy Mother    Healthy Father    Social History Social History   Tobacco Use   Smoking status: Former    Current  packs/day: 0.00    Types: Cigarettes    Quit date: 09/23/2017    Years since quitting: 5.1   Smokeless tobacco: Never  Vaping Use   Vaping status: Never Used  Substance Use Topics   Alcohol use: Yes    Comment: occ   Drug use: No   Allergies   Ibuprofen and Pollen extract  Review of Systems Review of Systems Pertinent findings revealed after performing a 14 point review of systems has been noted in the history of present illness.  Physical Exam Vital Signs BP 121/76 (BP Location: Left Arm)   Pulse 72   Temp 97.9 F (36.6 C) (Oral)   Resp 16   Ht 5' 4.5" (1.638 m)   Wt 175 lb (79.4 kg)   LMP 10/11/2022 (Approximate)   SpO2 98%   BMI 29.57 kg/m   No data found.  Physical Exam Vitals and nursing note reviewed.  Constitutional:      General: She is not in acute distress.    Appearance: Normal appearance. She is not ill-appearing.  HENT:     Head: Normocephalic and atraumatic.  Eyes:     General: Lids are normal.  Right eye: No discharge.        Left eye: No discharge.     Extraocular Movements: Extraocular movements intact.     Conjunctiva/sclera: Conjunctivae normal.     Right eye: Right conjunctiva is not injected.     Left eye: Left conjunctiva is not injected.  Neck:     Trachea: Trachea and phonation normal.  Cardiovascular:     Rate and Rhythm: Normal rate and regular rhythm.     Pulses: Normal pulses.     Heart sounds: Normal heart sounds. No murmur heard.    No friction rub. No gallop.  Pulmonary:     Effort: Pulmonary effort is normal. No accessory muscle usage, prolonged expiration or respiratory distress.     Breath sounds: Normal breath sounds. No stridor, decreased air movement or transmitted upper airway sounds. No decreased breath sounds, wheezing, rhonchi or rales.  Chest:     Chest wall: No tenderness.  Genitourinary:    Comments: Patient politely declines pelvic exam today, patient provided a vaginal swab for testing. Musculoskeletal:         General: Normal range of motion.     Cervical back: Normal range of motion and neck supple. Normal range of motion.  Lymphadenopathy:     Cervical: No cervical adenopathy.  Skin:    General: Skin is warm and dry.     Findings: No erythema or rash.  Neurological:     General: No focal deficit present.     Mental Status: She is alert and oriented to person, place, and time.  Psychiatric:        Mood and Affect: Mood normal.        Behavior: Behavior normal.     Visual Acuity Right Eye Distance:   Left Eye Distance:   Bilateral Distance:    Right Eye Near:   Left Eye Near:    Bilateral Near:     UC Couse / Diagnostics / Procedures:     Radiology No results found.  Procedures Procedures (including critical care time) EKG  Pending results:  Labs Reviewed  HIV ANTIBODY (ROUTINE TESTING W REFLEX)  RPR  CERVICOVAGINAL ANCILLARY ONLY    Medications Ordered in UC: Medications - No data to display  UC Diagnoses / Final Clinical Impressions(s)   I have reviewed the triage vital signs and the nursing notes.  Pertinent labs & imaging results that were available during my care of the patient were reviewed by me and considered in my medical decision making (see chart for details).    Final diagnoses:  Screening examination for STD (sexually transmitted disease)   STD screening was performed, patient advised that the results be posted to their MyChart and if any of the results are positive, they will be notified by phone, further treatment will be provided as indicated based on results of STD screening. Patient was advised to abstain from sexual intercourse until that they receive the results of their STD testing.  Patient was also advised to use condoms to protect themselves from STD exposure.  Please see discharge instructions below for details of plan of care as provided to patient. ED Prescriptions   None    PDMP not reviewed this encounter.  Disposition Upon  Discharge:  Condition: stable for discharge home  Patient presented with concern for an acute illness with associated systemic symptoms and significant discomfort requiring urgent management. In my opinion, this is a condition that a prudent lay person (someone who possesses an average knowledge  of health and medicine) may potentially expect to result in complications if not addressed urgently such as respiratory distress, impairment of bodily function or dysfunction of bodily organs.   As such, the patient has been evaluated and assessed, work-up was performed and treatment was provided in alignment with urgent care protocols and evidence based medicine.  Patient/parent/caregiver has been advised that the patient may require follow up for further testing and/or treatment if the symptoms continue in spite of treatment, as clinically indicated and appropriate.  Routine symptom specific, illness specific and/or disease specific instructions were discussed with the patient and/or caregiver at length.  Prevention strategies for avoiding STD exposure were also discussed.  The patient will follow up with their current PCP if and as advised. If the patient does not currently have a PCP we will assist them in obtaining one.   The patient may need specialty follow up if the symptoms continue, in spite of conservative treatment and management, for further workup, evaluation, consultation and treatment as clinically indicated and appropriate.  Patient/parent/caregiver verbalized understanding and agreement of plan as discussed.  All questions were addressed during visit.  Please see discharge instructions below for further details of plan.  Discharge Instructions:   Discharge Instructions      The results of your vaginal swab test which screens for BV, yeast, gonorrhea, chlamydia and trichomonas will be made posted to your MyChart account once it is complete.  This typically takes 2 to 4 days.  Please  abstain from sexual intercourse of any kind, vaginal, oral or anal, until you have received the results of your STD testing.     The results of your HIV and syphilis blood tests will be made available to you once they are complete.  They will initially be posted to your MyChart account which typically takes 2 to 3 days.     If any of your results are abnormal, you will receive a phone call regarding treatment.  Prescriptions, if any are needed, will be provided for you at your pharmacy.     Thank you for visiting urgent care today.  I appreciate the opportunity to participate in your care.       This office note has been dictated using Teaching laboratory technician.  Unfortunately, this method of dictation can sometimes lead to typographical or grammatical errors.  I apologize for your inconvenience in advance if this occurs.  Please do not hesitate to reach out to me if clarification is needed.       Theadora Rama Scales, [place] 11/13/22 [phone]

## 2022-11-13 NOTE — Discharge Instructions (Signed)
The results of your vaginal swab test which screens for BV, yeast, gonorrhea, chlamydia and trichomonas will be made posted to your MyChart account once it is complete.  This typically takes 2 to 4 days.  Please abstain from sexual intercourse of any kind, vaginal, oral or anal, until you have received the results of your STD testing.     The results of your HIV and syphilis blood tests will be made available to you once they are complete.  They will initially be posted to your MyChart account which typically takes 2 to 3 days.     If any of your results are abnormal, you will receive a phone call regarding treatment.  Prescriptions, if any are needed, will be provided for you at your pharmacy.     Thank you for visiting urgent care today.  I appreciate the opportunity to participate in your care.

## 2022-11-14 LAB — RPR: RPR Ser Ql: NONREACTIVE

## 2022-11-15 ENCOUNTER — Telehealth: Payer: Self-pay | Admitting: Emergency Medicine

## 2022-11-15 LAB — CERVICOVAGINAL ANCILLARY ONLY
Bacterial Vaginitis (gardnerella): NEGATIVE
Candida Glabrata: POSITIVE — AB
Candida Vaginitis: POSITIVE — AB
Chlamydia: NEGATIVE
Comment: NEGATIVE
Comment: NEGATIVE
Comment: NEGATIVE
Comment: NEGATIVE
Comment: NEGATIVE
Comment: NORMAL
Neisseria Gonorrhea: NEGATIVE
Trichomonas: NEGATIVE

## 2022-11-15 MED ORDER — FLUCONAZOLE 150 MG PO TABS: 150.0000 mg | ORAL_TABLET | Freq: Once | ORAL | 0 refills | Status: AC

## 2023-01-03 ENCOUNTER — Encounter (HOSPITAL_COMMUNITY): Payer: Self-pay

## 2023-01-03 ENCOUNTER — Ambulatory Visit (HOSPITAL_COMMUNITY)
Admission: EM | Admit: 2023-01-03 | Discharge: 2023-01-03 | Disposition: A | Discharge status: Home / Self Care | Payer: Medicaid Other | Attending: Emergency Medicine | Admitting: Emergency Medicine

## 2023-01-03 DIAGNOSIS — Z113 Encounter for screening for infections with a predominantly sexual mode of transmission: Secondary | ICD-10-CM | POA: Diagnosis present

## 2023-01-03 NOTE — ED Provider Notes (Signed)
[facility]    CSN: [id number] Arrival date & time: 01/03/23  1040      History   Chief Complaint Chief Complaint  Patient presents with   std check    HPI Barbara Ramos is a 32 y.o. female.   Patient presents to clinic requesting STI testing.  Denies vaginal discharge, vaginal sores, lesions, dysuria or any odor.  Reports she would like her routine screening, she does get it every few months to ensure she is negative.  Would like HIV and syphilis screening.  The history is provided by the patient and medical records.    Past Medical History:  Diagnosis Date   Allergy    Anxiety    Migraines    Sickle cell trait [facility])     Patient Active Problem List   Diagnosis Date Noted   Biological false positive RPR test 04/10/2019   History of syphilis 04/07/2019   SVD (spontaneous vaginal delivery) 06/18/2018   IUFD at 20 weeks or more of gestation 06/18/2018   Abruptio placenta, third trimester 06/17/2018    Past Surgical History:  Procedure Laterality Date   FOOT SURGERY     WISDOM TOOTH EXTRACTION      OB History     Gravida  5   Para  3   Term  2   Preterm  1   AB  2   Living  2      SAB      IAB      Ectopic      Multiple  0   Live Births  2            Home Medications    Prior to Admission medications   Medication Sig Start Date End Date Taking? Authorizing Provider  ferrous sulfate 325 (65 FE) MG tablet Take 1 tablet (325 mg total) by mouth daily with breakfast. Patient not taking: Reported on 04/04/2019 06/19/18 09/15/19  Adam Phenix, MD    Family History Family History  Problem Relation Age of Onset   Healthy Mother    Healthy Father     Social History Social History   Tobacco Use   Smoking status: Some Days    Current packs/day: 0.00    Types: Cigarettes    Last attempt to quit: 09/23/2017    Years since quitting: 5.2   Smokeless tobacco: Never  Vaping Use   Vaping status: Never Used  Substance  Use Topics   Alcohol use: Not Currently    Comment: occ   Drug use: No     Allergies   Ibuprofen and Pollen extract   Review of Systems Review of Systems  Constitutional:  Negative for fever.  Genitourinary:  Negative for dysuria, frequency, genital sores, hematuria, vaginal bleeding, vaginal discharge and vaginal pain.     Physical Exam Triage Vital Signs ED Triage Vitals  Encounter Vitals Group     BP 01/03/23 1217 116/83     Systolic BP Percentile --      Diastolic BP Percentile --      Pulse Rate 01/03/23 1217 80     Resp 01/03/23 1217 19     Temp 01/03/23 1217 98.2 F (36.8 C)     Temp Source 01/03/23 1217 Oral     SpO2 01/03/23 1217 98 %     Weight 01/03/23 1216 176 lb (79.8 kg)     Height 01/03/23 1216 5\' 4"  (1.626 m)     Head Circumference --  Peak Flow --      Pain Score 01/03/23 1216 0     Pain Loc --      Pain Education --      Exclude from Growth Chart --    No data found.  Updated Vital Signs BP 116/83 (BP Location: Left Arm)   Pulse 80   Temp 98.2 F (36.8 C) (Oral)   Resp 19   Ht 5\' 4"  (1.626 m)   Wt 176 lb (79.8 kg)   LMP 12/16/2022   SpO2 98%   BMI 30.21 kg/m   Visual Acuity Right Eye Distance:   Left Eye Distance:   Bilateral Distance:    Right Eye Near:   Left Eye Near:    Bilateral Near:     Physical Exam Vitals and nursing note reviewed.  Constitutional:      Appearance: Normal appearance.  HENT:     Head: Normocephalic and atraumatic.     Right Ear: External ear normal.     Left Ear: External ear normal.     Nose: Nose normal.     Mouth/Throat:     Mouth: Mucous membranes are moist.  Eyes:     General: No scleral icterus. Cardiovascular:     Rate and Rhythm: Normal rate.  Pulmonary:     Effort: Pulmonary effort is normal. No respiratory distress.  Musculoskeletal:        General: Normal range of motion.  Neurological:     General: No focal deficit present.     Mental Status: She is alert and oriented to  person, place, and time.  Psychiatric:        Mood and Affect: Mood normal.        Behavior: Behavior normal.      UC Treatments / Results  Labs (all labs ordered are listed, but only abnormal results are displayed) Labs Reviewed  RPR  HIV ANTIBODY (ROUTINE TESTING W REFLEX)  CERVICOVAGINAL ANCILLARY ONLY    EKG   Radiology No results found.  Procedures Procedures (including critical care time)  Medications Ordered in UC Medications - No data to display  Initial Impression / Assessment and Plan / UC Course  I have reviewed the triage vital signs and the nursing notes.  Pertinent labs & imaging results that were available during my care of the patient were reviewed by me and considered in my medical decision making (see chart for details).  Vitals and triage reviewed, patient is hemodynamically stable.  Requesting asymptomatic STI screening.  Cytology swab as well as lab work obtained.  Staff will contact if any treatment is indicated.  Plan of care, follow-up care and return precautions given, no questions at this time. Work note provided.      Final Clinical Impressions(s) / UC Diagnoses   Final diagnoses:  Screening examination for sexually transmitted disease     Discharge Instructions      We have screened you for sexually transmitted infections and we will contact you if any treatment is needed.  In the meantime, please abstain from intercourse until all results have been received.  Return to clinic for any new or urgent symptoms.     ED Prescriptions   None    PDMP not reviewed this encounter.   Gladys Gutman, [place], [place] 01/03/23 1235

## 2023-01-03 NOTE — Discharge Instructions (Addendum)
We have screened you for sexually transmitted infections and we will contact you if any treatment is needed.  In the meantime, please abstain from intercourse until all results have been received.  Return to clinic for any new or urgent symptoms.

## 2023-01-03 NOTE — ED Triage Notes (Signed)
Pt is here for std check. Pt denies any symptoms.

## 2023-01-04 LAB — CERVICOVAGINAL ANCILLARY ONLY
Bacterial Vaginitis (gardnerella): POSITIVE — AB
Candida Glabrata: NEGATIVE
Candida Vaginitis: NEGATIVE
Chlamydia: NEGATIVE
Comment: NEGATIVE
Comment: NEGATIVE
Comment: NEGATIVE
Comment: NEGATIVE
Comment: NEGATIVE
Comment: NORMAL
Neisseria Gonorrhea: NEGATIVE
Trichomonas: NEGATIVE

## 2023-01-05 ENCOUNTER — Telehealth: Payer: Self-pay

## 2023-01-05 MED ORDER — METRONIDAZOLE 500 MG PO TABS: 500.0000 mg | ORAL_TABLET | Freq: Two times a day (BID) | ORAL | 0 refills | Status: DC

## 2023-01-05 NOTE — Telephone Encounter (Signed)
Per protocol, pt requires tx with metronidazole. Attempted to reach patient x1. Unable to LVM.  Rx sent to pharmacy on file.

## 2023-01-09 ENCOUNTER — Other Ambulatory Visit: Payer: Self-pay

## 2023-01-09 ENCOUNTER — Emergency Department (HOSPITAL_COMMUNITY)
Admission: EM | Admit: 2023-01-09 | Discharge: 2023-01-10 | Disposition: A | Discharge status: Home / Self Care | Payer: Medicaid Other | Attending: Emergency Medicine | Admitting: Emergency Medicine

## 2023-01-09 DIAGNOSIS — D72829 Elevated white blood cell count, unspecified: Secondary | ICD-10-CM | POA: Insufficient documentation

## 2023-01-09 DIAGNOSIS — R519 Headache, unspecified: Secondary | ICD-10-CM | POA: Insufficient documentation

## 2023-01-09 DIAGNOSIS — R112 Nausea with vomiting, unspecified: Secondary | ICD-10-CM | POA: Diagnosis not present

## 2023-01-09 DIAGNOSIS — H53149 Visual discomfort, unspecified: Secondary | ICD-10-CM | POA: Insufficient documentation

## 2023-01-09 LAB — CBC WITH DIFFERENTIAL/PLATELET
Abs Immature Granulocytes: 0.06 10*3/uL (ref 0.00–0.07)
Basophils Absolute: 0.1 10*3/uL (ref 0.0–0.1)
Basophils Relative: 0 %
Eosinophils Absolute: 0.8 10*3/uL — ABNORMAL HIGH (ref 0.0–0.5)
Eosinophils Relative: 5 %
HCT: 39.3 % (ref 36.0–46.0)
Hemoglobin: 13.2 g/dL (ref 12.0–15.0)
Immature Granulocytes: 0 %
Lymphocytes Relative: 21 %
Lymphs Abs: 3.4 10*3/uL (ref 0.7–4.0)
MCH: 28.3 pg (ref 26.0–34.0)
MCHC: 33.6 g/dL (ref 30.0–36.0)
MCV: 84.2 fL (ref 80.0–100.0)
Monocytes Absolute: 0.9 10*3/uL (ref 0.1–1.0)
Monocytes Relative: 6 %
Neutro Abs: 10.9 10*3/uL — ABNORMAL HIGH (ref 1.7–7.7)
Neutrophils Relative %: 68 %
Platelets: 387 10*3/uL (ref 150–400)
RBC: 4.67 MIL/uL (ref 3.87–5.11)
RDW: 13.5 % (ref 11.5–15.5)
WBC: 16.1 10*3/uL — ABNORMAL HIGH (ref 4.0–10.5)
nRBC: 0 % (ref 0.0–0.2)

## 2023-01-09 LAB — COMPREHENSIVE METABOLIC PANEL
ALT: 15 U/L (ref 0–44)
AST: 18 U/L (ref 15–41)
Albumin: 4 g/dL (ref 3.5–5.0)
Alkaline Phosphatase: 55 U/L (ref 38–126)
Anion gap: 14 (ref 5–15)
BUN: 7 mg/dL (ref 6–20)
CO2: 24 mmol/L (ref 22–32)
Calcium: 9.2 mg/dL (ref 8.9–10.3)
Chloride: 100 mmol/L (ref 98–111)
Creatinine, Ser: 0.75 mg/dL (ref 0.44–1.00)
GFR, Estimated: >60 mL/min (ref >60)
Glucose, Bld: 95 mg/dL (ref 70–99)
Potassium: 4.2 mmol/L (ref 3.5–5.1)
Sodium: 138 mmol/L (ref 135–145)
Total Bilirubin: 0.9 mg/dL (ref 0.3–1.2)
Total Protein: 7.9 g/dL (ref 6.5–8.1)

## 2023-01-09 NOTE — ED Triage Notes (Signed)
Patient reports migraine headache with photophobia , lightheaded and emesis onset yesterday .

## 2023-01-10 MED ORDER — PROCHLORPERAZINE EDISYLATE 10 MG/2ML IJ SOLN
10.0000 mg | Freq: Once | INTRAMUSCULAR | Status: AC
  Administered 2023-01-10: 10 mg via INTRAVENOUS
  Filled 2023-01-10: qty 2

## 2023-01-10 NOTE — ED Provider Notes (Signed)
[place] EMERGENCY DEPARTMENT AT [facility] Provider Note   CSN: [id number] Arrival date & time: 01/09/23  1858     History  Chief Complaint  Patient presents with   Migraine    Barbara Ramos is a 32 y.o. female.  The history is provided by the patient.  Headache Onset quality:  Gradual Duration:  2 days Timing:  Constant Progression:  Worsening Chronicity:  Recurrent Similar to prior headaches: yes   Associated symptoms: vomiting   Associated symptoms: no dizziness, no fever, no focal weakness and no visual change   Patient with history of migraines, anxiety presents for headache.  Patient reports this headache started around 2 days ago and its mostly on the right side.  She reports nausea and vomiting and photophobia.  No fevers.  No visual changes.  No focal weakness.  She gets similar type headaches several times per month.  No recent head trauma.  No previous history of CVA or aneurysms or known family history. She reports her grandmother told her to go the ER to get checked out for aneurysms    Past Medical History:  Diagnosis Date   Allergy    Anxiety    Migraines    Sickle cell trait (HCC)     Home Medications Prior to Admission medications   Medication Sig Start Date End Date Taking? Authorizing Provider  ferrous sulfate 325 (65 FE) MG tablet Take 1 tablet (325 mg total) by mouth daily with breakfast. Patient not taking: Reported on 04/04/2019 06/19/18 09/15/19  Adam Phenix, MD      Allergies    Ibuprofen and Pollen extract    Review of Systems   Review of Systems  Constitutional:  Negative for fever.  Eyes:  Negative for visual disturbance.  Gastrointestinal:  Positive for vomiting.  Neurological:  Positive for headaches. Negative for dizziness and focal weakness.    Physical Exam Updated Vital Signs BP (!) 135/90 (BP Location: Left Arm)   Pulse 89   Temp 98 F (36.7 C)   Resp 18   LMP 12/16/2022   SpO2 99%  Physical  Exam CONSTITUTIONAL: Well developed/well nourished, patient sleeping in stretcher with another person HEAD: Normocephalic/atraumatic EYES: EOMI/PERRL, no nystagmus, no ptosis ENMT: Mucous membranes moist NECK: supple no meningeal signs SPINE/BACK:entire spine nontender CV: S1/S2 noted, no murmurs/rubs/gallops noted NEURO:Awake/alert, face symmetric, no arm or leg drift is noted Equal 5/5 strength with shoulder abduction, elbow flex/extension, wrist flex/extension in upper extremities and equal hand grips bilaterally Equal 5/5 strength with hip flexion,knee flex/extension, foot dorsi/plantar flexion Cranial nerves 3/4/5/6/10/30/08/11/12 tested and intact Gait normal without ataxia No past pointing Sensation to light touch intact in all extremities EXTREMITIES: pulses normal, full ROM SKIN: warm, color normal PSYCH: no abnormalities of mood noted, alert and oriented to situation  ED Results / Procedures / Treatments   Labs (all labs ordered are listed, but only abnormal results are displayed) Labs Reviewed  CBC WITH DIFFERENTIAL/PLATELET - Abnormal; Notable for the following components:      Result Value   WBC 16.1 (*)    Neutro Abs 10.9 (*)    Eosinophils Absolute 0.8 (*)    All other components within normal limits  COMPREHENSIVE METABOLIC PANEL    EKG None  Radiology No results found.  Procedures Procedures    Medications Ordered in ED Medications  prochlorperazine (COMPAZINE) injection 10 mg (has no administration in time range)    ED Course/ Medical Decision Making/ A&P  Medical Decision Making Amount and/or Complexity of Data Reviewed Labs: ordered.  Risk Prescription drug management.   This patient presents to the ED for concern of headache, this involves an extensive number of treatment options, and is a complaint that carries with it a high risk of complications and morbidity.  The differential diagnosis includes but is  not limited to subarachnoid hemorrhage, intracranial hemorrhage, meningitis, encephalitis, CVST, temporal arteritis, idiopathic intracranial hypertension, migraine    Comorbidities that complicate the patient evaluation: Patient's presentation is complicated by their history of anxiety  Social Determinants of Health: Patient's impaired access to primary care and multiple ER visits   increases the complexity of managing their presentation   Lab Tests: I Ordered, and personally interpreted labs.  The pertinent results include: Leukocytosis   Medicines ordered and prescription drug management: I ordered medication including Compazine for headache  Test Considered: Patient is unremarkable neurologic exam is overall well-appearing, will defer any imaging at this time Low suspicion for meningitis, low suspicion for IIH, low suspicion for other acute neurologic emergency  Complexity of problems addressed: Patient's presentation is most consistent with  exacerbation of chronic illness  Disposition: After consideration of the diagnostic results and the patient's response to treatment,  I feel that the patent would benefit from discharge   .           Final Clinical Impression(s) / ED Diagnoses Final diagnoses:  Nonintractable headache, unspecified chronicity pattern, unspecified headache type    Rx / DC Orders ED Discharge Orders     None         Zadie Rhine, MD 01/10/23 0403

## 2023-01-10 NOTE — Discharge Instructions (Signed)
You are having a headache. No specific cause was found today for your headache. It may have been a migraine or other cause of headache. Stress, anxiety, fatigue, and depression are common triggers for headaches. Your headache today does not appear to be life-threatening or require hospitalization, but often the exact cause of headaches is not determined in the emergency department. Therefore, follow-up with your doctor is very important to find out what may have caused your headache, and whether or not you need any further diagnostic testing or treatment. Sometimes headaches can appear benign (not harmful), but then more serious symptoms can develop which should prompt an immediate re-evaluation by your doctor or the emergency department.  SEEK MEDICAL ATTENTION IF:  You develop possible problems with medications prescribed.  The medications don't resolve your headache, if it recurs , or if you have multiple episodes of vomiting or can't take fluids. You have a change from the usual headache.  RETURN IMMEDIATELY IF you develop a sudden, severe headache or confusion, become poorly responsive or faint, develop a fever above 100.13F or problem breathing, have a change in speech, vision, swallowing, or understanding, or develop new weakness, numbness, tingling, incoordination, or have a seizure.

## 2023-02-23 ENCOUNTER — Encounter: Payer: Medicaid Other | Admitting: Obstetrics and Gynecology

## 2023-03-08 ENCOUNTER — Other Ambulatory Visit (HOSPITAL_COMMUNITY)
Admission: RE | Admit: 2023-03-08 | Discharge: 2023-03-08 | Disposition: A | Discharge status: Home / Self Care | Payer: Medicaid Other | Source: Ambulatory Visit | Attending: Obstetrics & Gynecology | Admitting: Obstetrics & Gynecology

## 2023-03-08 ENCOUNTER — Encounter: Payer: Self-pay | Admitting: Obstetrics & Gynecology

## 2023-03-08 ENCOUNTER — Ambulatory Visit: Payer: Medicaid Other | Admitting: Obstetrics & Gynecology

## 2023-03-08 VITALS — BP 135/78 | HR 74 | Ht 65.0 in | Wt 182.0 lb

## 2023-03-08 DIAGNOSIS — A5901 Trichomonal vulvovaginitis: Secondary | ICD-10-CM

## 2023-03-08 DIAGNOSIS — Z01419 Encounter for gynecological examination (general) (routine) without abnormal findings: Secondary | ICD-10-CM

## 2023-03-08 NOTE — Progress Notes (Signed)
32 y.o. New GYN presents for AEX/PAP/STD screening.  Pt uses [facility] for [facility].

## 2023-03-08 NOTE — Progress Notes (Signed)
GYNECOLOGY CLINIC ANNUAL PREVENTATIVE CARE ENCOUNTER NOTE  Subjective:   Barbara Ramos is a 32 y.o. [phone] female here for a routine annual gynecologic exam.  Current complaints: delayed period. She states that she has been having unprotected intercourse as she is trying to conceive. She is currently seven days late from her scheduled period. She noticed some postcoital spotting, however, she did not fully start her period. Denies abnormal vaginal bleeding, discharge, pelvic pain, breast pain, vagina growths, or problems with intercourse or other gynecologic concerns.    Gynecologic History Patient's last menstrual period was 01/28/2023 (exact date). Contraception: none Last Pap: 2019. Results were: normal  Obstetric History OB History  Gravida Para Term Preterm AB Living  5 3 2 1 2 2   SAB IAB Ectopic Multiple Live Births        0 2    # Outcome Date GA Lbr Len/2nd Weight Sex Type Anes PTL Lv  5 Preterm 06/18/18 [redacted]w[redacted]d 692:49 / 00:11 5 lb 0.4 oz (2.28 kg) M Vag-Spont EPI  FD     Complications: Placental abruption  4 Term 03/28/14    M Vag-Spont   LIV  3 Term 12/27/10    F Vag-Spont   LIV  2 AB           1 AB             Past Medical History:  Diagnosis Date   Allergy    Anemia    Anxiety    Migraines    Sickle cell trait (HCC)     Past Surgical History:  Procedure Laterality Date   FOOT SURGERY     WISDOM TOOTH EXTRACTION      Current Outpatient Medications on File Prior to Visit  Medication Sig Dispense Refill   [DISCONTINUED] ferrous sulfate 325 (65 FE) MG tablet Take 1 tablet (325 mg total) by mouth daily with breakfast. (Patient not taking: Reported on 04/04/2019) 30 tablet 3   No current facility-administered medications on file prior to visit.    Allergies  Allergen Reactions   Ibuprofen Other (See Comments)    "triggers headaches"   Pollen Extract Other (See Comments)    seasonal    Social History   Socioeconomic History   Marital status:  Single    Spouse name: Not on file   Number of children: 2   Years of education: Not on file   Highest education level: Not on file  Occupational History   Not on file  Tobacco Use   Smoking status: Some Days    Current packs/day: 0.00    Types: Cigarettes    Last attempt to quit: 09/23/2017    Years since quitting: 5.4   Smokeless tobacco: Never  Vaping Use   Vaping status: Never Used  Substance and Sexual Activity   Alcohol use: Yes    Comment: occ   Drug use: No   Sexual activity: Yes    Birth control/protection: None, Condom  Other Topics Concern   Not on file  Social History Narrative   Not on file   Social Determinants of Health   Financial Resource Strain: Not on file  Food Insecurity: Not on file  Transportation Needs: Not on file  Physical Activity: Not on file  Stress: Not on file  Social Connections: Unknown (09/05/2021)   Received from [facility], [facility]   Social Network    Social Network: Not on file  Intimate Partner Violence: Unknown (07/29/2021)   Received from [facility], [facility]   HITS    Physically Hurt: Not on file    Insult or Talk Down To: Not on file    Threaten Physical Harm: Not on file    Scream or Curse: Not on file    Family History  Problem Relation Age of Onset   Healthy Mother    Healthy Father    Cancer Maternal Grandmother    Cancer Maternal Grandfather     The following portions of the patient's history were reviewed and updated as appropriate: allergies, current medications, past family history, past medical history, past social history, past surgical history and problem list.  Review of Systems Pertinent items are noted in HPI.   Objective:  BP 135/78   Pulse 74   Ht 5\' 5"  (1.651 m)   Wt 182 lb (82.6 kg)   LMP 01/28/2023 (Exact Date)   BMI 30.29 kg/m  CONSTITUTIONAL: Well-developed, well-nourished female in no acute distress.  HENT:  Normocephalic, atraumatic, External right and left ear normal.  Oropharynx is clear and moist EYES: Conjunctivae and EOM are normal. Pupils are equal, round, and reactive to light. No scleral icterus.  NECK: Normal range of motion, supple, no masses.  SKIN: Skin is warm and dry. No rash noted. Not diaphoretic. No erythema. No pallor. NEUROLGIC: Alert and oriented to person, place, and time. Normal reflexes, muscle tone coordination. No cranial nerve deficit noted. PSYCHIATRIC: Normal mood and affect. Normal behavior. Normal judgment and thought content. CARDIOVASCULAR: Normal heart rate noted, regular rhythm RESPIRATORY: Clear to auscultation bilaterally. Effort and breath sounds normal, no problems with respiration noted. BREASTS: Symmetric in size. No masses, skin changes, nipple drainage, or lymphadenopathy. ABDOMEN: Soft, normal bowel sounds, no distention noted.  No tenderness, rebound or guarding.  PELVIC: Normal appearing external genitalia; normal appearing vaginal mucosa and cervix.  No abnormal discharge noted.  Pap smear obtained.  Normal uterine size, no other palpable masses, no uterine or adnexal tenderness. MUSCULOSKELETAL: Normal range of motion.   Assessment:  Annual gynecologic examination with pap smear   Plan:  Will follow up results of pap smear and UA pregnancy results and manage accordingly. Routine preventative health maintenance measures emphasized. Please refer to After Visit Summary for other counseling recommendations.   Adam Phenix, MD 03/08/2023

## 2023-03-09 LAB — CERVICOVAGINAL ANCILLARY ONLY
Bacterial Vaginitis (gardnerella): NEGATIVE
Chlamydia: NEGATIVE
Comment: NEGATIVE
Comment: NEGATIVE
Comment: NEGATIVE
Comment: NORMAL
Neisseria Gonorrhea: NEGATIVE
Trichomonas: POSITIVE — AB

## 2023-03-09 LAB — HEPATITIS C ANTIBODY: Hep C Virus Ab: NONREACTIVE

## 2023-03-09 LAB — RPR: RPR Ser Ql: NONREACTIVE

## 2023-03-09 LAB — HEPATITIS B SURFACE ANTIGEN: Hepatitis B Surface Ag: NEGATIVE

## 2023-03-09 LAB — HIV ANTIBODY (ROUTINE TESTING W REFLEX): HIV Screen 4th Generation wRfx: NONREACTIVE

## 2023-03-10 MED ORDER — METRONIDAZOLE 500 MG PO TABS
2000.0000 mg | ORAL_TABLET | Freq: Once | ORAL | 0 refills | Status: AC
Start: 2023-03-10 — End: 2023-03-10

## 2023-03-10 NOTE — Addendum Note (Signed)
Addended by: Adam Phenix on: 03/10/2023 09:08 AM   Modules accepted: Orders

## 2023-03-12 LAB — CYTOLOGY - PAP
Comment: NEGATIVE
Diagnosis: NEGATIVE
High risk HPV: NEGATIVE

## 2023-03-13 MED ORDER — METRONIDAZOLE 500 MG PO TABS
2000.0000 mg | ORAL_TABLET | Freq: Once | ORAL | 0 refills | Status: AC
Start: 2023-03-13 — End: 2023-03-13

## 2023-03-13 NOTE — Addendum Note (Signed)
Addended by: Adam Phenix on: 03/13/2023 11:29 AM   Modules accepted: Orders

## 2023-06-11 ENCOUNTER — Emergency Department (HOSPITAL_BASED_OUTPATIENT_CLINIC_OR_DEPARTMENT_OTHER)
Admission: EM | Admit: 2023-06-11 | Discharge: 2023-06-12 | Discharge status: Against Medical Advice | Payer: Self-pay | Attending: Emergency Medicine | Admitting: Emergency Medicine

## 2023-06-11 ENCOUNTER — Encounter (HOSPITAL_BASED_OUTPATIENT_CLINIC_OR_DEPARTMENT_OTHER): Payer: Self-pay | Admitting: Emergency Medicine

## 2023-06-11 ENCOUNTER — Other Ambulatory Visit: Payer: Self-pay

## 2023-06-11 DIAGNOSIS — Z202 Contact with and (suspected) exposure to infections with a predominantly sexual mode of transmission: Secondary | ICD-10-CM | POA: Insufficient documentation

## 2023-06-11 DIAGNOSIS — Z5321 Procedure and treatment not carried out due to patient leaving prior to being seen by health care provider: Secondary | ICD-10-CM | POA: Insufficient documentation

## 2023-06-11 DIAGNOSIS — Z20822 Contact with and (suspected) exposure to covid-19: Secondary | ICD-10-CM | POA: Insufficient documentation

## 2023-06-11 DIAGNOSIS — R059 Cough, unspecified: Secondary | ICD-10-CM | POA: Insufficient documentation

## 2023-06-11 DIAGNOSIS — R1110 Vomiting, unspecified: Secondary | ICD-10-CM | POA: Insufficient documentation

## 2023-06-11 LAB — HIV ANTIBODY (ROUTINE TESTING W REFLEX): HIV Screen 4th Generation wRfx: NONREACTIVE

## 2023-06-11 LAB — RESP PANEL BY RT-PCR (RSV, FLU A&B, COVID)  RVPGX2
Influenza A by PCR: NEGATIVE
Influenza B by PCR: NEGATIVE
Resp Syncytial Virus by PCR: NEGATIVE
SARS Coronavirus 2 by RT PCR: NEGATIVE

## 2023-06-11 NOTE — ED Triage Notes (Addendum)
Cough and sneezing x 2 days . Emesis today .   Also adds she had unprotected  sexual intercourse 1 week ago . Requesting STD check and testing

## 2023-06-12 LAB — RPR: RPR Ser Ql: NONREACTIVE
